# Patient Record
Sex: Female | Born: 1950 | Race: White | Hispanic: No | Marital: Single | Smoking: Never smoker
Health system: Southern US, Community
[De-identification: ages and names within clinical notes are randomized; demographics above are authoritative.]

## PROBLEM LIST (undated history)

## (undated) DIAGNOSIS — T7840XA Allergy, unspecified, initial encounter: Secondary | ICD-10-CM

## (undated) DIAGNOSIS — Z8542 Personal history of malignant neoplasm of other parts of uterus: Secondary | ICD-10-CM

## (undated) DIAGNOSIS — D649 Anemia, unspecified: Secondary | ICD-10-CM

## (undated) DIAGNOSIS — C801 Malignant (primary) neoplasm, unspecified: Secondary | ICD-10-CM

## (undated) DIAGNOSIS — I872 Venous insufficiency (chronic) (peripheral): Secondary | ICD-10-CM

## (undated) DIAGNOSIS — F419 Anxiety disorder, unspecified: Secondary | ICD-10-CM

## (undated) DIAGNOSIS — N189 Chronic kidney disease, unspecified: Secondary | ICD-10-CM

## (undated) DIAGNOSIS — E119 Type 2 diabetes mellitus without complications: Secondary | ICD-10-CM

## (undated) DIAGNOSIS — M1990 Unspecified osteoarthritis, unspecified site: Secondary | ICD-10-CM

## (undated) DIAGNOSIS — H409 Unspecified glaucoma: Secondary | ICD-10-CM

## (undated) DIAGNOSIS — E785 Hyperlipidemia, unspecified: Secondary | ICD-10-CM

## (undated) DIAGNOSIS — Z9889 Other specified postprocedural states: Secondary | ICD-10-CM

## (undated) DIAGNOSIS — I10 Essential (primary) hypertension: Secondary | ICD-10-CM

## (undated) DIAGNOSIS — H9190 Unspecified hearing loss, unspecified ear: Secondary | ICD-10-CM

## (undated) DIAGNOSIS — Z8541 Personal history of malignant neoplasm of cervix uteri: Secondary | ICD-10-CM

## (undated) DIAGNOSIS — T8859XA Other complications of anesthesia, initial encounter: Secondary | ICD-10-CM

## (undated) DIAGNOSIS — E2839 Other primary ovarian failure: Secondary | ICD-10-CM

## (undated) DIAGNOSIS — G629 Polyneuropathy, unspecified: Secondary | ICD-10-CM

## (undated) DIAGNOSIS — J45909 Unspecified asthma, uncomplicated: Secondary | ICD-10-CM

## (undated) DIAGNOSIS — F32A Depression, unspecified: Secondary | ICD-10-CM

## (undated) DIAGNOSIS — M5440 Lumbago with sciatica, unspecified side: Secondary | ICD-10-CM

## (undated) DIAGNOSIS — Z860100 Personal history of colon polyps, unspecified: Secondary | ICD-10-CM

## (undated) DIAGNOSIS — R112 Nausea with vomiting, unspecified: Secondary | ICD-10-CM

## (undated) HISTORY — DX: Other primary ovarian failure: E28.39

## (undated) HISTORY — DX: Unspecified osteoarthritis, unspecified site: M19.90

## (undated) HISTORY — PX: EYE SURGERY: SHX253

## (undated) HISTORY — DX: Personal history of malignant neoplasm of cervix uteri: Z85.41

## (undated) HISTORY — DX: Depression, unspecified: F32.A

## (undated) HISTORY — PX: MENISCUS REPAIR: SHX5179

## (undated) HISTORY — DX: Personal history of colon polyps, unspecified: Z86.0100

## (undated) HISTORY — PX: ABDOMINAL HYSTERECTOMY: SHX81

## (undated) HISTORY — DX: Morbid (severe) obesity due to excess calories: E66.01

## (undated) HISTORY — PX: LIPOMA EXCISION: SHX5283

## (undated) HISTORY — DX: Venous insufficiency (chronic) (peripheral): I87.2

## (undated) HISTORY — DX: Unspecified glaucoma: H40.9

## (undated) HISTORY — DX: Hyperlipidemia, unspecified: E78.5

## (undated) HISTORY — DX: Type 2 diabetes mellitus without complications: E11.9

## (undated) HISTORY — DX: Personal history of malignant neoplasm of other parts of uterus: Z85.42

## (undated) HISTORY — DX: Unspecified hearing loss, unspecified ear: H91.90

## (undated) HISTORY — DX: Essential (primary) hypertension: I10

## (undated) HISTORY — DX: Allergy, unspecified, initial encounter: T78.40XA

## (undated) HISTORY — DX: Chronic kidney disease, unspecified: N18.9

## (undated) HISTORY — DX: Lumbago with sciatica, unspecified side: M54.40

## (undated) HISTORY — PX: APPENDECTOMY: SHX54

## (undated) HISTORY — DX: Malignant (primary) neoplasm, unspecified: C80.1

---

## 2008-12-23 ENCOUNTER — Ambulatory Visit: Admission: RE | Admit: 2008-12-23 | Discharge: 2009-03-09 | Payer: Self-pay | Admitting: Radiation Oncology

## 2009-03-18 ENCOUNTER — Ambulatory Visit: Admission: RE | Admit: 2009-03-18 | Discharge: 2009-04-10 | Payer: Self-pay | Admitting: Radiation Oncology

## 2011-12-12 ENCOUNTER — Other Ambulatory Visit: Payer: Self-pay | Admitting: Family Medicine

## 2011-12-12 ENCOUNTER — Ambulatory Visit
Admission: RE | Admit: 2011-12-12 | Discharge: 2011-12-12 | Disposition: A | Discharge status: Home / Self Care | Payer: Commercial Managed Care - PPO | Source: Ambulatory Visit | Attending: Family Medicine | Admitting: Family Medicine

## 2011-12-12 DIAGNOSIS — M79606 Pain in leg, unspecified: Secondary | ICD-10-CM

## 2013-11-13 ENCOUNTER — Ambulatory Visit (INDEPENDENT_AMBULATORY_CARE_PROVIDER_SITE_OTHER): Payer: Commercial Managed Care - PPO | Admitting: Family Medicine

## 2013-11-13 VITALS — BP 120/74 | HR 96 | Temp 99.2°F | Resp 18 | Ht 68.5 in | Wt 341.1 lb

## 2013-11-13 DIAGNOSIS — J069 Acute upper respiratory infection, unspecified: Secondary | ICD-10-CM

## 2013-11-13 DIAGNOSIS — R059 Cough, unspecified: Secondary | ICD-10-CM

## 2013-11-13 DIAGNOSIS — J329 Chronic sinusitis, unspecified: Secondary | ICD-10-CM

## 2013-11-13 DIAGNOSIS — H6120 Impacted cerumen, unspecified ear: Secondary | ICD-10-CM

## 2013-11-13 DIAGNOSIS — R05 Cough: Secondary | ICD-10-CM

## 2013-11-13 DIAGNOSIS — H6121 Impacted cerumen, right ear: Secondary | ICD-10-CM

## 2013-11-13 MED ORDER — AMOXICILLIN 875 MG PO TABS: 875.0000 mg | ORAL_TABLET | Freq: Two times a day (BID) | ORAL | Status: DC

## 2013-11-13 MED ORDER — HYDROCODONE-HOMATROPINE 5-1.5 MG/5ML PO SYRP: 5.0000 mL | ORAL_SOLUTION | ORAL | Status: DC | PRN

## 2013-11-13 NOTE — Progress Notes (Signed)
Subjective: Patient has been sick with a cough, wheezing, sinus congestion and discomfort, and her ears stuffy. She feels like she had a lot of wax in her ear. She tried to irrigate them out.  Objective: Her left TM is normal right is occluded by cerumen. Her sinuses are tender. Throat clear. Right nares is very inflamed. Look like there is a polyp or pseudopolyp up in there but she has no history of chronic congestion. Her chest is fairly clear but is minimal tightness on expiration.  Ears were irrigated and a large plug removed from the right ear. She.  Assessment: Cerumen impaction Sinusitis Cough  Plan: Cough syrup Amoxicillin Return if not improved Drink plenty of fluids

## 2013-11-13 NOTE — Patient Instructions (Signed)
Drink plenty of fluids  Amoxicillin 875 mg twice daily  Hycodan cough syrup 1 teaspoon every 4-6 hours as needed for cough  Return if worse  If the ears continue to bother you take an antihistamine decongestant such as Claritin-D or Allegra-D or Zyrtec-D to see if we can get eustachian tubes open up.

## 2018-08-12 DIAGNOSIS — R69 Illness, unspecified: Secondary | ICD-10-CM | POA: Diagnosis not present

## 2018-10-01 DIAGNOSIS — R05 Cough: Secondary | ICD-10-CM | POA: Diagnosis not present

## 2018-10-01 DIAGNOSIS — J0140 Acute pansinusitis, unspecified: Secondary | ICD-10-CM | POA: Diagnosis not present

## 2018-10-13 DIAGNOSIS — Z1231 Encounter for screening mammogram for malignant neoplasm of breast: Secondary | ICD-10-CM | POA: Diagnosis not present

## 2018-10-28 DIAGNOSIS — M545 Low back pain: Secondary | ICD-10-CM | POA: Diagnosis not present

## 2018-10-28 DIAGNOSIS — R609 Edema, unspecified: Secondary | ICD-10-CM | POA: Diagnosis not present

## 2018-10-28 DIAGNOSIS — E782 Mixed hyperlipidemia: Secondary | ICD-10-CM | POA: Diagnosis not present

## 2018-10-28 DIAGNOSIS — R69 Illness, unspecified: Secondary | ICD-10-CM | POA: Diagnosis not present

## 2018-10-28 DIAGNOSIS — I10 Essential (primary) hypertension: Secondary | ICD-10-CM | POA: Diagnosis not present

## 2018-10-28 DIAGNOSIS — J45909 Unspecified asthma, uncomplicated: Secondary | ICD-10-CM | POA: Diagnosis not present

## 2018-10-28 DIAGNOSIS — E1169 Type 2 diabetes mellitus with other specified complication: Secondary | ICD-10-CM | POA: Diagnosis not present

## 2018-10-28 DIAGNOSIS — N183 Chronic kidney disease, stage 3 (moderate): Secondary | ICD-10-CM | POA: Diagnosis not present

## 2018-11-03 DIAGNOSIS — R69 Illness, unspecified: Secondary | ICD-10-CM | POA: Diagnosis not present

## 2018-11-03 DIAGNOSIS — M5416 Radiculopathy, lumbar region: Secondary | ICD-10-CM | POA: Diagnosis not present

## 2018-11-05 ENCOUNTER — Other Ambulatory Visit: Payer: Self-pay | Admitting: Orthopedic Surgery

## 2018-11-05 DIAGNOSIS — M5416 Radiculopathy, lumbar region: Secondary | ICD-10-CM

## 2018-11-07 ENCOUNTER — Ambulatory Visit
Admission: RE | Admit: 2018-11-07 | Discharge: 2018-11-07 | Disposition: A | Discharge status: Home / Self Care | Payer: Medicare Other | Source: Ambulatory Visit | Attending: Orthopedic Surgery | Admitting: Orthopedic Surgery

## 2018-11-07 DIAGNOSIS — M48061 Spinal stenosis, lumbar region without neurogenic claudication: Secondary | ICD-10-CM | POA: Diagnosis not present

## 2018-11-07 DIAGNOSIS — M5116 Intervertebral disc disorders with radiculopathy, lumbar region: Secondary | ICD-10-CM | POA: Diagnosis not present

## 2018-11-07 DIAGNOSIS — M5416 Radiculopathy, lumbar region: Secondary | ICD-10-CM

## 2018-11-10 DIAGNOSIS — R69 Illness, unspecified: Secondary | ICD-10-CM | POA: Diagnosis not present

## 2018-11-11 DIAGNOSIS — M48061 Spinal stenosis, lumbar region without neurogenic claudication: Secondary | ICD-10-CM | POA: Diagnosis not present

## 2018-11-12 DIAGNOSIS — M48061 Spinal stenosis, lumbar region without neurogenic claudication: Secondary | ICD-10-CM | POA: Diagnosis not present

## 2018-11-12 DIAGNOSIS — M4316 Spondylolisthesis, lumbar region: Secondary | ICD-10-CM | POA: Diagnosis not present

## 2018-11-18 DIAGNOSIS — M48061 Spinal stenosis, lumbar region without neurogenic claudication: Secondary | ICD-10-CM | POA: Diagnosis not present

## 2018-11-19 DIAGNOSIS — M48061 Spinal stenosis, lumbar region without neurogenic claudication: Secondary | ICD-10-CM | POA: Diagnosis not present

## 2018-11-19 DIAGNOSIS — M4316 Spondylolisthesis, lumbar region: Secondary | ICD-10-CM | POA: Diagnosis not present

## 2018-11-24 DIAGNOSIS — M48061 Spinal stenosis, lumbar region without neurogenic claudication: Secondary | ICD-10-CM | POA: Diagnosis not present

## 2018-11-24 DIAGNOSIS — M4316 Spondylolisthesis, lumbar region: Secondary | ICD-10-CM | POA: Diagnosis not present

## 2018-12-03 DIAGNOSIS — M48061 Spinal stenosis, lumbar region without neurogenic claudication: Secondary | ICD-10-CM | POA: Diagnosis not present

## 2018-12-03 DIAGNOSIS — M4316 Spondylolisthesis, lumbar region: Secondary | ICD-10-CM | POA: Diagnosis not present

## 2018-12-10 DIAGNOSIS — M48061 Spinal stenosis, lumbar region without neurogenic claudication: Secondary | ICD-10-CM | POA: Diagnosis not present

## 2018-12-10 DIAGNOSIS — M4316 Spondylolisthesis, lumbar region: Secondary | ICD-10-CM | POA: Diagnosis not present

## 2018-12-11 DIAGNOSIS — M48061 Spinal stenosis, lumbar region without neurogenic claudication: Secondary | ICD-10-CM | POA: Diagnosis not present

## 2018-12-17 DIAGNOSIS — M4316 Spondylolisthesis, lumbar region: Secondary | ICD-10-CM | POA: Diagnosis not present

## 2018-12-17 DIAGNOSIS — M48061 Spinal stenosis, lumbar region without neurogenic claudication: Secondary | ICD-10-CM | POA: Diagnosis not present

## 2019-01-27 DIAGNOSIS — R69 Illness, unspecified: Secondary | ICD-10-CM | POA: Diagnosis not present

## 2019-01-27 DIAGNOSIS — E782 Mixed hyperlipidemia: Secondary | ICD-10-CM | POA: Diagnosis not present

## 2019-01-27 DIAGNOSIS — J45909 Unspecified asthma, uncomplicated: Secondary | ICD-10-CM | POA: Diagnosis not present

## 2019-01-27 DIAGNOSIS — R609 Edema, unspecified: Secondary | ICD-10-CM | POA: Diagnosis not present

## 2019-01-27 DIAGNOSIS — L84 Corns and callosities: Secondary | ICD-10-CM | POA: Diagnosis not present

## 2019-01-27 DIAGNOSIS — I10 Essential (primary) hypertension: Secondary | ICD-10-CM | POA: Diagnosis not present

## 2019-01-27 DIAGNOSIS — E1169 Type 2 diabetes mellitus with other specified complication: Secondary | ICD-10-CM | POA: Diagnosis not present

## 2019-01-27 DIAGNOSIS — N183 Chronic kidney disease, stage 3 (moderate): Secondary | ICD-10-CM | POA: Diagnosis not present

## 2019-02-05 ENCOUNTER — Other Ambulatory Visit: Payer: Self-pay

## 2019-02-05 ENCOUNTER — Ambulatory Visit: Payer: Medicare HMO | Admitting: Podiatry

## 2019-02-05 ENCOUNTER — Encounter: Payer: Self-pay | Admitting: Podiatry

## 2019-02-05 ENCOUNTER — Ambulatory Visit (INDEPENDENT_AMBULATORY_CARE_PROVIDER_SITE_OTHER): Payer: Medicare HMO

## 2019-02-05 VITALS — Temp 96.6°F

## 2019-02-05 DIAGNOSIS — E1149 Type 2 diabetes mellitus with other diabetic neurological complication: Secondary | ICD-10-CM

## 2019-02-05 DIAGNOSIS — L97329 Non-pressure chronic ulcer of left ankle with unspecified severity: Secondary | ICD-10-CM

## 2019-02-05 DIAGNOSIS — L84 Corns and callosities: Secondary | ICD-10-CM

## 2019-02-09 NOTE — Progress Notes (Signed)
Subjective:   Patient ID: Wendy Soto, female   DOB: 68 y.o.   MRN: [id number]   HPI 68 year old female presents the office today for concerns of a callus on the bottom of her left foot.  She states that she thinks she may have cut her foot several months ago.  She does not recall any specific injury or any bleeding she denies of any foreign objects.  She thinks this was a cut because she noticed some blood in the callus formation.  She denies any redness or red streaks and she denies any drainage or pus.  Has had no recent treatment.   Review of Systems  All other systems reviewed and are negative.  Past Medical History:  Diagnosis Date  . Allergy   . Cancer ([place])   . Hypertension     Past Surgical History:  Procedure Laterality Date  . ABDOMINAL HYSTERECTOMY    . APPENDECTOMY    . EYE SURGERY       Current Outpatient Medications:  .  albuterol (VENTOLIN HFA) 108 (90 Base) MCG/ACT inhaler, Inhale into the lungs every 6 (six) hours as needed for wheezing or shortness of breath., Disp: , Rfl:  .  budesonide-formoterol (SYMBICORT) 160-4.5 MCG/ACT inhaler, Inhale 2 puffs into the lungs 2 (two) times daily., Disp: , Rfl:  .  fexofenadine (ALLEGRA) 180 MG tablet, Take 180 mg by mouth daily., Disp: , Rfl:  .  POTASSIUM CHLORIDE PO, Take by mouth., Disp: , Rfl:  .  rosuvastatin (CRESTOR) 5 MG tablet, Take 5 mg by mouth daily., Disp: , Rfl:  .  citalopram (CELEXA) 40 MG tablet, Take 40 mg by mouth daily., Disp: , Rfl:  .  fenofibrate (TRICOR) 145 MG tablet, Take 145 mg by mouth daily., Disp: , Rfl:  .  furosemide (LASIX) 40 MG tablet, Take 40 mg by mouth daily., Disp: , Rfl:  .  LISINOPRIL PO, Take 20 mg by mouth 1 day or 1 dose. , Disp: , Rfl:   No Known Allergies       Objective:  Physical Exam  General: AAO x3, NAD  Dermatological: Hyperkeratotic tissue right foot submetatarsal 1 area.  There was some evidence of dried blood.  Upon debridement there is no ongoing ulceration,  drainage or any signs of infection to the area is pre-ulcerative.  There is no surrounding erythema, ascending cellulitis.  There is no fluctuation or crepitation malodor.  Minimal callus in the same area on the contralateral extremity.  Vascular: Dorsalis Pedis artery and Posterior Tibial artery pedal pulses are 2/4 bilateral with immedate capillary fill time. There is no pain with calf compression, swelling, warmth, erythema.   Neruologic: Sensation mildly decreased with Semmes Weinstein monofilament  Musculoskeletal: No gross boney pedal deformities bilateral. No pain, crepitus, or limitation noted with foot and ankle range of motion bilateral. Muscular strength 5/5 in all groups tested bilateral.     Assessment:   Pre-ulcerative callus left foot     Plan:  -Treatment options discussed including all alternatives, risks, and complications -Etiology of symptoms were discussed -X-rays were obtained and reviewed with the patient.  There is no evidence of acute fracture or stress fracture or any evidence of foreign body. -I debrided the hyperkeratotic lesion without any complications or bleeding.  Discussed moisturizer to the area daily as well as offloading pads which were dispensed today.  Discussed shoe modifications as well. -Mild neuropathy is evident today.  We will continue to monitor.  No significant symptoms of this. -  Discussed importance of daily foot inspection.  Return in about 6 weeks (around 03/19/2019).  Trula Slade DPM

## 2019-03-06 ENCOUNTER — Ambulatory Visit: Payer: Medicare HMO | Admitting: Podiatry

## 2019-04-03 ENCOUNTER — Other Ambulatory Visit: Payer: Self-pay

## 2019-04-03 ENCOUNTER — Ambulatory Visit: Payer: Medicare HMO | Admitting: Podiatry

## 2019-04-03 VITALS — Temp 97.2°F

## 2019-04-03 DIAGNOSIS — E1149 Type 2 diabetes mellitus with other diabetic neurological complication: Secondary | ICD-10-CM

## 2019-04-03 DIAGNOSIS — R200 Anesthesia of skin: Secondary | ICD-10-CM

## 2019-04-03 DIAGNOSIS — L84 Corns and callosities: Secondary | ICD-10-CM

## 2019-04-03 DIAGNOSIS — E119 Type 2 diabetes mellitus without complications: Secondary | ICD-10-CM | POA: Insufficient documentation

## 2019-04-03 DIAGNOSIS — R208 Other disturbances of skin sensation: Secondary | ICD-10-CM

## 2019-04-03 NOTE — Progress Notes (Signed)
Subjective: 68 year old female presents the office today for 2 concerns.  She states that she has a still on the ball of her left foot.  It was painful but it is doing better.  She is not sure why it became painful several weeks ago but the tenderness has improved.  She denies any redness or drainage or any swelling.  She also states that the left foot is been getting numb and this occurs mostly when she stands in a grocery store where she stands at home for.  Time.  Typically in this time she is not wearing supportive shoes or going barefoot at home.  She does not get the numbness when she wears supportive shoes during the day. She does have a history of degenerative disc disease which affects her right side but not her left.  This is been ongoing for last 6 weeks.  She denies any weakness or falls.  Denies any systemic complaints such as fevers, chills, nausea, vomiting. No acute changes since last appointment, and no other complaints at this time.  She is type II diabetic but she does not take medication. Her last A1c was "5-something".    Objective: AAO x3, NAD DP/PT pulses palpable bilaterally, CRT less than 3 seconds Sensation decreased with Semmes-Weinstein monofilament.  There is also decrease in vibratory sensation with the left side worse than the right.  There is no pain with pinpoint tenderness.  No edema, erythema.  MMT 5/5.  Range of motion intact. Plantarflexed first ray resulting hyperkeratotic lesion submetatarsal 5.  Upon debridement no ongoing ulceration drainage or signs of infection however the are is pre-ulcerative.  Mild hyperkeratotic lesion fifth metatarsal base right foot without any underlying ulceration or signs of infection. No open lesions or pre-ulcerative lesions.  No pain with calf compression, swelling, warmth, erythema  Assessment: Pre-ulcerative callus left foot; numbness left foot  Plan: -All treatment options discussed with the patient including all  alternatives, risks, complications.  -Debrided the hyperkeratotic lesion without any complications bleeding.  Continue offloading. -She states the entire left foot is the number does not go above the ankle. No falls or weakness.  She states this is occurring when she does not wear supportive shoes or going barefoot.  We discussed wearing more supportive shoes to see if this will be helpful not diabetic.  Also late on getting an injection in her back does not schedule this.  Concerned that maybe her back is causing some of the issues to her left foot becoming numb.  If this is not the case that she is not getting improvement with supportive shoes and change in shoe gear ordered nerve conduction test. -Patient encouraged to call the office with any questions, concerns, change in symptoms.   Trula Slade DPM

## 2019-04-04 DIAGNOSIS — Z791 Long term (current) use of non-steroidal anti-inflammatories (NSAID): Secondary | ICD-10-CM | POA: Diagnosis not present

## 2019-04-04 DIAGNOSIS — E1151 Type 2 diabetes mellitus with diabetic peripheral angiopathy without gangrene: Secondary | ICD-10-CM | POA: Diagnosis not present

## 2019-04-04 DIAGNOSIS — M1990 Unspecified osteoarthritis, unspecified site: Secondary | ICD-10-CM | POA: Diagnosis not present

## 2019-04-04 DIAGNOSIS — J45909 Unspecified asthma, uncomplicated: Secondary | ICD-10-CM | POA: Diagnosis not present

## 2019-04-04 DIAGNOSIS — E785 Hyperlipidemia, unspecified: Secondary | ICD-10-CM | POA: Diagnosis not present

## 2019-04-04 DIAGNOSIS — R32 Unspecified urinary incontinence: Secondary | ICD-10-CM | POA: Diagnosis not present

## 2019-04-04 DIAGNOSIS — R69 Illness, unspecified: Secondary | ICD-10-CM | POA: Diagnosis not present

## 2019-04-04 DIAGNOSIS — R609 Edema, unspecified: Secondary | ICD-10-CM | POA: Diagnosis not present

## 2019-04-04 DIAGNOSIS — I10 Essential (primary) hypertension: Secondary | ICD-10-CM | POA: Diagnosis not present

## 2019-04-04 DIAGNOSIS — G8929 Other chronic pain: Secondary | ICD-10-CM | POA: Diagnosis not present

## 2019-04-09 ENCOUNTER — Ambulatory Visit: Payer: Medicare HMO | Admitting: Podiatry

## 2019-04-30 DIAGNOSIS — M48061 Spinal stenosis, lumbar region without neurogenic claudication: Secondary | ICD-10-CM | POA: Diagnosis not present

## 2019-05-04 DIAGNOSIS — N3090 Cystitis, unspecified without hematuria: Secondary | ICD-10-CM | POA: Diagnosis not present

## 2019-05-11 ENCOUNTER — Encounter: Payer: Self-pay | Admitting: Podiatry

## 2019-05-15 ENCOUNTER — Ambulatory Visit: Payer: Medicare HMO | Admitting: Podiatry

## 2019-05-22 DIAGNOSIS — R399 Unspecified symptoms and signs involving the genitourinary system: Secondary | ICD-10-CM | POA: Diagnosis not present

## 2019-05-27 DIAGNOSIS — R350 Frequency of micturition: Secondary | ICD-10-CM | POA: Diagnosis not present

## 2019-06-19 ENCOUNTER — Telehealth: Payer: Self-pay | Admitting: *Deleted

## 2019-06-19 ENCOUNTER — Ambulatory Visit: Payer: Medicare HMO | Admitting: Podiatry

## 2019-06-19 ENCOUNTER — Other Ambulatory Visit: Payer: Self-pay

## 2019-06-19 ENCOUNTER — Encounter: Payer: Self-pay | Admitting: Podiatry

## 2019-06-19 VITALS — Temp 97.9°F

## 2019-06-19 DIAGNOSIS — R200 Anesthesia of skin: Secondary | ICD-10-CM

## 2019-06-19 DIAGNOSIS — R208 Other disturbances of skin sensation: Secondary | ICD-10-CM | POA: Diagnosis not present

## 2019-06-19 DIAGNOSIS — L84 Corns and callosities: Secondary | ICD-10-CM

## 2019-06-19 DIAGNOSIS — G629 Polyneuropathy, unspecified: Secondary | ICD-10-CM

## 2019-06-19 DIAGNOSIS — E1149 Type 2 diabetes mellitus with other diabetic neurological complication: Secondary | ICD-10-CM

## 2019-06-19 NOTE — Telephone Encounter (Signed)
Faxed orders, clinicals and demographics to [facility]. 

## 2019-06-19 NOTE — Telephone Encounter (Signed)
done

## 2019-06-19 NOTE — Telephone Encounter (Signed)
-----   Message from Trula Slade, DPM sent at 06/19/2019  9:18 AM EDT ----- Can you please order NCV due to numbness b/l L> R

## 2019-06-19 NOTE — Telephone Encounter (Signed)
Prepared orders to be faxed to [facility] once 06/19/2019 clinicals are available.

## 2019-06-19 NOTE — Progress Notes (Signed)
Subjective: 68 year old female presents the office today for follow-up evaluation of numbness to mostly her left foot.  The numbness is mostly after she is been standing about 10 to 15 minutes and mostly when she is going grocery shopping.  She did have an injection in her back which helped some but never went away.  She also has a callus on the ball of her right foot so that trimmed.  No redness or any open sores. Denies any systemic complaints such as fevers, chills, nausea, vomiting. No acute changes since last appointment, and no other complaints at this time.   Objective: AAO x3, NAD DP/PT pulses palpable bilaterally, CRT less than 3 seconds Sensation decreased with Semmes-Weinstein monofilament bilaterally.  Negative Tinel sign. Hyperkeratotic lesion left foot submetatarsal 1.  No ongoing ulceration drainage or any signs of infection.  No open lesions. No pain with calf compression, swelling, warmth, erythema  Assessment: Neuropathy, hyperkeratotic pre-ulcerative lesion  Plan: -All treatment options discussed with the patient including all alternatives, risks, complications.  -Given her neuropathy as well as the numbness to her feet will order nerve conduction test. -Hyperkeratotic lesion sharply debrided x1 without any complications or bleeding. -Patient encouraged to call the office with any questions, concerns, change in symptoms.   RTC after NCV  Trula Slade DPM

## 2019-07-28 ENCOUNTER — Other Ambulatory Visit: Payer: Self-pay

## 2019-07-28 ENCOUNTER — Ambulatory Visit (INDEPENDENT_AMBULATORY_CARE_PROVIDER_SITE_OTHER): Payer: Medicare HMO | Admitting: Neurology

## 2019-07-28 DIAGNOSIS — E1149 Type 2 diabetes mellitus with other diabetic neurological complication: Secondary | ICD-10-CM | POA: Diagnosis not present

## 2019-07-28 DIAGNOSIS — M5417 Radiculopathy, lumbosacral region: Secondary | ICD-10-CM

## 2019-07-28 NOTE — Procedures (Signed)
[facility]  [address], [address]  [place], [place] [address] Tel: [phone] Fax:  [phone] Test Date:  07/28/2019  Patient: Wendy Soto DOB: [birth year] Physician: Narda Amber, DO  Sex: Female Height: 5\' 8"  Ref Phys: Celesta Gentile, DPM  ID#: [id number] Temp: 32.0C Technician:    Patient Complaints: This is a 68 year old female with diabetic neuropathy referred for evaluation of left foot numbness following prolonged standing or walking.  NCV & EMG Findings: Extensive electrodiagnostic testing of the left lower extremity and additional studies of the right shows:  1. Bilateral sural and superficial peroneal sensory responses are within normal limits. 2. Bilateral tibial and left peroneal (EDB) motor responses are absent.  Right peroneal (EDB) motor response is markedly reduced.  Bilateral peroneal motor responses at the tibialis anterior are within normal limits. 3. Tibial H reflex study is prolonged on the left, and normal on the right. 4. Chronic motor axonal loss changes are seen affecting the muscles below the knee as well as bilateral gluteus medius muscles.  There is no evidence of accompanied active denervation.   Impression: 1. The electrophysiologic findings are consistent with a chronic and symmetric sensorimotor axonal polyneuropathy affecting the lower extremities. 2. There is evidence of a superimposed L5 radiculopathy affecting bilateral lower extremities, worse on the left.    ___________________________ Narda Amber, DO    Nerve Conduction Studies Anti Sensory Summary Table   Site NR Peak (ms) Norm Peak (ms) P-T Amp (V) Norm P-T Amp  Left Sup Peroneal Anti Sensory (Ant Lat Mall)  32C  12 cm NR  <4.6  >3  Right Sup Peroneal Anti Sensory (Ant Lat Mall)  32C  12 cm NR  <4.6  >3  Left Sural Anti Sensory (Lat Mall)  32C  Calf NR  <4.6  >3  Right Sural Anti Sensory (Lat Mall)  32C  Calf NR  <4.6  >3   Motor Summary Table   Site NR  Onset (ms) Norm Onset (ms) O-P Amp (mV) Norm O-P Amp Site1 Site2 Delta-0 (ms) Dist (cm) Vel (m/s) Norm Vel (m/s)  Left Peroneal Motor (Ext Dig Brev)  32C  Ankle NR  <6.0  >2.5 B Fib Ankle  0.0  >40  B Fib NR     Poplt B Fib  0.0  >40  Poplt NR            Right Peroneal Motor (Ext Dig Brev)  32C  Ankle    3.4 <6.0 0.8 >2.5 B Fib Ankle 9.1 36.0 40 >40  B Fib    12.5  0.8  Poplt B Fib 1.3 6.0 46 >40  Poplt    13.8  0.7         Left Peroneal TA Motor (Tib Ant)  32C  Fib Head    2.9 <4.5 3.3 >3 Poplit Fib Head 1.1 7.0 64 >40  Poplit    4.0  3.3         Right Peroneal TA Motor (Tib Ant)  32C  Fib Head    3.0 <4.5 3.3 >3 Poplit Fib Head 1.2 7.0 58 >40  Poplit    4.2  2.9         Left Tibial Motor (Abd Hall Brev)  32C  Ankle NR  <6.0  >4 Knee Ankle  0.0  >40  Knee NR            Right Tibial Motor (Abd Hall Brev)  32C  Ankle NR  <6.0  >4  Knee Ankle  0.0  >40  Knee NR             H Reflex Studies   NR H-Lat (ms) Lat Norm (ms) L-R H-Lat (ms)  Left Tibial (Gastroc)  32C     40.00 <35 5.03  Right Tibial (Gastroc)  32C     34.97 <35 5.03   EMG   Side Muscle Ins Act Fibs Psw Fasc Number Recrt Dur Dur. Amp Amp. Poly Poly. Comment  Left AntTibialis Nml Nml Nml Nml 2- Rapid Many 1+ Many 1+ Some 1+ N/A  Left Gastroc Nml Nml Nml Nml 2- Rapid Few 1+ Few 1+ Nml Nml N/A  Left RectFemoris Nml Nml Nml Nml 1- Mod-V Nml Nml Nml Nml Nml Nml N/A  Left BicepsFemS Nml Nml Nml Nml Nml Nml Nml Nml Nml Nml Nml Nml N/A  Left GluteusMed Nml Nml Nml Nml 1- Rapid Many 1+ Many 1+ Some 1+ N/A  Right AntTibialis Nml Nml Nml Nml 1- Rapid Many 1+ Many 1+ Many 1+ N/A  Right Gastroc Nml Nml Nml Nml 2- Rapid Few 1+ Few 1+ Nml Nml N/A  Right RectFemoris Nml Nml Nml Nml Nml Nml Nml Nml Nml Nml Nml Nml N/A  Right GluteusMed Nml Nml Nml Nml 1- Rapid Some 1+ Some 1+ Nml Nml N/A  Right BicepsFemS Nml Nml Nml Nml Nml Nml Nml Nml Nml Nml Nml Nml N/A      Waveforms:

## 2019-07-31 ENCOUNTER — Telehealth: Payer: Self-pay | Admitting: *Deleted

## 2019-07-31 DIAGNOSIS — G629 Polyneuropathy, unspecified: Secondary | ICD-10-CM

## 2019-07-31 DIAGNOSIS — R208 Other disturbances of skin sensation: Secondary | ICD-10-CM

## 2019-07-31 DIAGNOSIS — R200 Anesthesia of skin: Secondary | ICD-10-CM

## 2019-07-31 NOTE — Telephone Encounter (Signed)
I informed pt of Dr. Leigh Aurora review of the NCV with EMG and referral to [facility]. Pt states understanding. Faxed referral, clinicals and demographics to [facility].

## 2019-07-31 NOTE — Telephone Encounter (Signed)
-----   Message from Trula Slade, DPM sent at 07/31/2019  7:30 AM EDT ----- Tivis Ringer- can you please refer her to neurology due to the L5 radiculopathy that is causing the numbness and please let her know. Thanks.

## 2019-08-03 ENCOUNTER — Other Ambulatory Visit (INDEPENDENT_AMBULATORY_CARE_PROVIDER_SITE_OTHER): Payer: Medicare HMO

## 2019-08-03 ENCOUNTER — Encounter: Payer: Self-pay | Admitting: Neurology

## 2019-08-03 ENCOUNTER — Other Ambulatory Visit: Payer: Self-pay

## 2019-08-03 ENCOUNTER — Ambulatory Visit: Payer: Medicare HMO | Admitting: Neurology

## 2019-08-03 VITALS — BP 146/78 | HR 98 | Ht 68.0 in | Wt 318.0 lb

## 2019-08-03 DIAGNOSIS — G629 Polyneuropathy, unspecified: Secondary | ICD-10-CM | POA: Diagnosis not present

## 2019-08-03 DIAGNOSIS — M5417 Radiculopathy, lumbosacral region: Secondary | ICD-10-CM | POA: Diagnosis not present

## 2019-08-03 DIAGNOSIS — M48062 Spinal stenosis, lumbar region with neurogenic claudication: Secondary | ICD-10-CM

## 2019-08-03 NOTE — Patient Instructions (Addendum)
Check labs.  We will call you with the results Your provider has requested that you have labwork completed today. Please go to [facility] ([id number]) on the second floor of this building before leaving the office today. You do not need to check in. If you are not called within 15 minutes please check with the front desk. Please follow-up with Dr. Lynann Bologna for your back

## 2019-08-03 NOTE — Progress Notes (Signed)
[facility] Clinic Note - Initial Visit   Date: 08/03/19  Wendy Soto MRN: [id number] DOB: 1950/11/09   Dear Dr. Jacqualyn Posey:  Thank you for your kind referral of Wendy Soto for consultation of left foot numbness. Although her history is well known to you, please allow [place] to reiterate it for the purpose of our medical record. The patient was accompanied to the clinic by self.    History of Present Illness: Wendy Soto is a 68 y.o. right-handed female with prediabetes, asthma, hyperlipidemia, history of cervical cancer, and depression/anxiety presenting for evaluation of left foot numbness.   Starting early 2020, she began having left foot numbness.  Numbness is not constant and only occurs after standing or walking for longer than 15 minutes.  If she rests, symptoms quickly improve.  She denies any foot weakness or shooting pain.  She has seen Dr. Lynann Bologna for low back pain and had MRI lumbar spine in January 2020 which showed moderate to severe L4-5 spinal canal stenosis.  She had 2 epidural steroid injections which may have helped alleviate the numbness transiently.  When her symptoms occur, it feels as if her left foot is in a boot.  At rest, she does endorse some numbness involving both feet.  Her brother has severe neuropathy and she has wondered whether she may be developing the same.  She is prediabetic.  She does not drink alcohol.  She underwent electrodiagnostic testing which I performed in September which showed chronic and symmetric sensorimotor polyneuropathy, as well as overlapping L5 radiculopathy bilaterally, worse on the left.  She works as a Retail buyer at [employer].  Out-side paper records, electronic medical record, and images have been reviewed where available and summarized as:  MRI lumbar spine wo contrast 11/07/2018: Mild spinal stenosis L1-2 with small left-sided disc protrusion  Mild spinal stenosis L2-3 and L3-4  Moderate to  severe spinal stenosis L4-5. Severe facet degeneration with grade 1 anterolisthesis.  NCS/EMG of the legs 07/20/2019: 1. The electrophysiologic findings are consistent with a chronic and symmetric sensorimotor axonal polyneuropathy affecting the lower extremities. 2. There is evidence of a superimposed L5 radiculopathy affecting bilateral lower extremities, worse on the left.   Past Medical History:  Diagnosis Date  . Allergy   . Cancer ([place])   . Hypertension     Past Surgical History:  Procedure Laterality Date  . ABDOMINAL HYSTERECTOMY    . APPENDECTOMY    . EYE SURGERY       Medications:  Outpatient Encounter Medications as of 08/03/2019  Medication Sig  . albuterol (VENTOLIN HFA) 108 (90 Base) MCG/ACT inhaler Inhale into the lungs every 6 (six) hours as needed for wheezing or shortness of breath.  . Blood Glucose Monitoring Suppl (ONE TOUCH ULTRA 2) w/Device KIT AS DIRECTED USE TO TEST BLOOD SUGARS DAILY INVITRO/ DX E11.65  . budesonide-formoterol (SYMBICORT) 160-4.5 MCG/ACT inhaler Inhale 2 puffs into the lungs 2 (two) times daily.  . citalopram (CELEXA) 40 MG tablet Take 40 mg by mouth daily.  . fenofibrate (TRICOR) 145 MG tablet Take 145 mg by mouth daily.  . fexofenadine (ALLEGRA) 180 MG tablet Take 180 mg by mouth daily.  . furosemide (LASIX) 40 MG tablet Take 40 mg by mouth daily.  Marland Kitchen LISINOPRIL PO Take 20 mg by mouth 1 day or 1 dose.   Marland Kitchen POTASSIUM CHLORIDE PO Take by mouth.  . rosuvastatin (CRESTOR) 5 MG tablet Take 5 mg by mouth daily.   No facility-administered  encounter medications on file as of 08/03/2019.     Allergies: No Known Allergies  Family History: Brother has lymphoma, neuropathy  Social History: Social History   Tobacco Use  . Smoking status: Never Smoker  . Smokeless tobacco: Never Used  Substance Use Topics  . Alcohol use: No  . Drug use: No   Social History   Social History Narrative   One story home   No children   Usually right  handed    Review of Systems:  CONSTITUTIONAL: No fevers, chills, night sweats, or weight loss.   EYES: No visual changes or eye pain ENT: No hearing changes.  No history of nose bleeds.   RESPIRATORY: No cough, wheezing and shortness of breath.   CARDIOVASCULAR: Negative for chest pain, and palpitations.   GI: Negative for abdominal discomfort, blood in stools or black stools.  No recent change in bowel habits.   GU:  No history of incontinence.   MUSCLOSKELETAL: No history of joint pain or swelling.  No myalgias.   SKIN: Negative for lesions, rash, and itching.   HEMATOLOGY/ONCOLOGY: Negative for prolonged bleeding, bruising easily, and swollen nodes.  No history of cancer.   ENDOCRINE: Negative for cold or heat intolerance, polydipsia or goiter.   PSYCH:  No depression or anxiety symptoms.   NEURO: As Above.   Vital Signs:  BP (!) 146/78   Pulse 98   Ht _0  (1.727 m)   Wt (!) 318 lb (144.2 kg)   SpO2 99%   BMI 48.35 kg/m    General Medical Exam:   General:  Well appearing, comfortable, morbidly obese.   Eyes/ENT: see cranial nerve examination.   Neck:   No carotid bruits. Respiratory:  Clear to auscultation, good air entry bilaterally.   Cardiac:  Regular rate and rhythm, no murmur.   Extremities:  No deformities, edema, or skin discoloration.  Skin:  No rashes or lesions.  Neurological Exam: MENTAL STATUS including orientation to time, place, person, recent and remote memory, attention span and concentration, language, and fund of knowledge is normal.  Speech is not dysarthric.  CRANIAL NERVES: II:  No visual field defects.   III-IV-VI: Pupils equal round and reactive to light.  Normal conjugate, extra-ocular eye movements in all directions of gaze.  No nystagmus.  No ptosis.   V:  Normal facial sensation.    VII:  Normal facial symmetry and movements.   VIII:  Normal hearing and vestibular function.   IX-X:  Normal palatal movement.   XI:  Normal shoulder shrug  and head rotation.   XII:  Normal tongue strength and range of motion, no deviation or fasciculation.  MOTOR:  No atrophy, fasciculations or abnormal movements.  No pronator drift.   Upper Extremity:  Right  Left  Deltoid  5/5   5/5   Biceps  5/5   5/5   Triceps  5/5   5/5   Infraspinatus 5/5  5/5  Medial pectoralis 5/5  5/5  Wrist extensors  5/5   5/5   Wrist flexors  5/5   5/5   Finger extensors  5/5   5/5   Finger flexors  5/5   5/5   Dorsal interossei  5/5   5/5   Abductor pollicis  5/5   5/5   Tone (Ashworth scale)  0  0   Lower Extremity:  Right  Left  Hip flexors  5/5   5/5   Hip extensors  5/5   5/5  Adductor 5/5  5/5  Abductor 5/5  5/5  Knee flexors  5/5   5/5   Knee extensors  5/5   5/5   Dorsiflexors  5/5   5/5   Plantarflexors  5/5   5/5   Toe extensors  5/5   5/5   Toe flexors  5/5   5/5   Tone (Ashworth scale)  0  0   MSRs:  Right        Left                  brachioradialis 2+  2+  biceps 2+  2+  triceps 2+  2+  patellar 2+  2+  ankle jerk 0  0  Hoffman no  no  plantar response down  down   SENSORY: Gradient pattern of pinprick and temperature loss distal to mid-calf bilaterally, absent vibration at the ankle and great toe bilaterally.  Romberg sign is mildly positive.  COORDINATION/GAIT: Normal finger-to- nose-finger.  Intact rapid alternating movements bilaterally.  Gait is wide-based due to body habitus, appears stable and unassisted.  IMPRESSION: 1.  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 Lynann Bologna  2.  Peripheral neuropathy with distal sensory loss, contributed by prediabetes  - Check vitamin B12, folate, vitamin B12, copper, SPEP with IFE, TSH  -  I had extensive discussion with the patient regarding the pathogenesis, etiology, management, and natural course of neuropathy. Neuropathy tends to be slowly progressive, especially if a treatable etiology is not identified.  She does  have prediabetes which is a risk factor for neuropathy.   - Patient educated on daily foot inspection, fall prevention, and safety precautions around the home.  Further recommendations pending results.  Thank you for allowing me to participate in patient's care.  If I can answer any additional questions, I would be pleased to do so.    Sincerely,    Tanmay Halteman K. Posey Pronto, DO

## 2019-08-10 DIAGNOSIS — M4807 Spinal stenosis, lumbosacral region: Secondary | ICD-10-CM | POA: Diagnosis not present

## 2019-08-12 LAB — PROTEIN ELECTROPHORESIS, SERUM
Albumin ELP: 3.8 g/dL (ref 3.8–4.8)
Alpha 1: 0.3 g/dL (ref 0.2–0.3)
Alpha 2: 0.7 g/dL (ref 0.5–0.9)
Beta 2: 0.3 g/dL (ref 0.2–0.5)
Beta Globulin: 0.5 g/dL (ref 0.4–0.6)
Gamma Globulin: 0.7 g/dL — ABNORMAL LOW (ref 0.8–1.7)
Total Protein: 6.3 g/dL (ref 6.1–8.1)

## 2019-08-12 LAB — B12 AND FOLATE PANEL
Folate: 6.9 ng/mL
Vitamin B-12: 267 pg/mL (ref 200–1100)

## 2019-08-12 LAB — IMMUNOFIXATION ELECTROPHORESIS
IgG (Immunoglobin G), Serum: 675 mg/dL (ref 600–1540)
IgM, Serum: 158 mg/dL (ref 50–300)
Immunofix Electr Int: NOT DETECTED
Immunoglobulin A: 61 mg/dL — ABNORMAL LOW (ref 70–320)

## 2019-08-12 LAB — VITAMIN B1: Vitamin B1 (Thiamine): 14 nmol/L (ref 8–30)

## 2019-08-12 LAB — COPPER, SERUM: Copper: 105 ug/dL (ref 70–175)

## 2019-08-13 ENCOUNTER — Telehealth: Payer: Self-pay

## 2019-08-13 DIAGNOSIS — R69 Illness, unspecified: Secondary | ICD-10-CM | POA: Diagnosis not present

## 2019-08-13 NOTE — Telephone Encounter (Signed)
Patient left vm returning your call

## 2019-08-13 NOTE — Telephone Encounter (Signed)
Pt advised.

## 2019-08-13 NOTE — Telephone Encounter (Signed)
-----   Message from Alda Berthold, DO sent at 08/13/2019  9:30 AM EDT ----- Please notify patient lab are within normal limits.  Thank you.

## 2019-08-21 DIAGNOSIS — E782 Mixed hyperlipidemia: Secondary | ICD-10-CM | POA: Diagnosis not present

## 2019-08-21 DIAGNOSIS — I10 Essential (primary) hypertension: Secondary | ICD-10-CM | POA: Diagnosis not present

## 2019-08-21 DIAGNOSIS — R609 Edema, unspecified: Secondary | ICD-10-CM | POA: Diagnosis not present

## 2019-08-21 DIAGNOSIS — R69 Illness, unspecified: Secondary | ICD-10-CM | POA: Diagnosis not present

## 2019-08-21 DIAGNOSIS — J45909 Unspecified asthma, uncomplicated: Secondary | ICD-10-CM | POA: Diagnosis not present

## 2019-08-21 DIAGNOSIS — E1169 Type 2 diabetes mellitus with other specified complication: Secondary | ICD-10-CM | POA: Diagnosis not present

## 2019-08-21 DIAGNOSIS — N183 Chronic kidney disease, stage 3 unspecified: Secondary | ICD-10-CM | POA: Diagnosis not present

## 2019-09-02 DIAGNOSIS — H40013 Open angle with borderline findings, low risk, bilateral: Secondary | ICD-10-CM | POA: Diagnosis not present

## 2019-09-02 DIAGNOSIS — E119 Type 2 diabetes mellitus without complications: Secondary | ICD-10-CM | POA: Diagnosis not present

## 2019-09-02 DIAGNOSIS — H25813 Combined forms of age-related cataract, bilateral: Secondary | ICD-10-CM | POA: Diagnosis not present

## 2019-09-02 DIAGNOSIS — H04123 Dry eye syndrome of bilateral lacrimal glands: Secondary | ICD-10-CM | POA: Diagnosis not present

## 2019-09-16 DIAGNOSIS — Z23 Encounter for immunization: Secondary | ICD-10-CM | POA: Diagnosis not present

## 2019-09-16 DIAGNOSIS — E782 Mixed hyperlipidemia: Secondary | ICD-10-CM | POA: Diagnosis not present

## 2019-09-16 DIAGNOSIS — E1169 Type 2 diabetes mellitus with other specified complication: Secondary | ICD-10-CM | POA: Diagnosis not present

## 2019-10-07 DIAGNOSIS — H5213 Myopia, bilateral: Secondary | ICD-10-CM | POA: Diagnosis not present

## 2019-10-07 DIAGNOSIS — H40013 Open angle with borderline findings, low risk, bilateral: Secondary | ICD-10-CM | POA: Diagnosis not present

## 2019-10-07 DIAGNOSIS — H524 Presbyopia: Secondary | ICD-10-CM | POA: Diagnosis not present

## 2019-10-16 DIAGNOSIS — Z1231 Encounter for screening mammogram for malignant neoplasm of breast: Secondary | ICD-10-CM | POA: Diagnosis not present

## 2019-12-03 DIAGNOSIS — M48061 Spinal stenosis, lumbar region without neurogenic claudication: Secondary | ICD-10-CM | POA: Diagnosis not present

## 2019-12-26 IMAGING — MR MR LUMBAR SPINE W/O CM
4 of 5 series · 26 of 48 positions shown · non-contrast
Comparison: None.

CLINICAL DATA: Lumbar radiculopathy

EXAM:
MRI LUMBAR SPINE WITHOUT CONTRAST
TECHNIQUE: Multiplanar, multisequence MR imaging of the lumbar spine was
performed. No intravenous contrast was administered.

[Series 3: T2 post-contrast · sagittal · 4.0mm · 0.55mm/px · 6 of 14 slices shown]
[im 1/14]
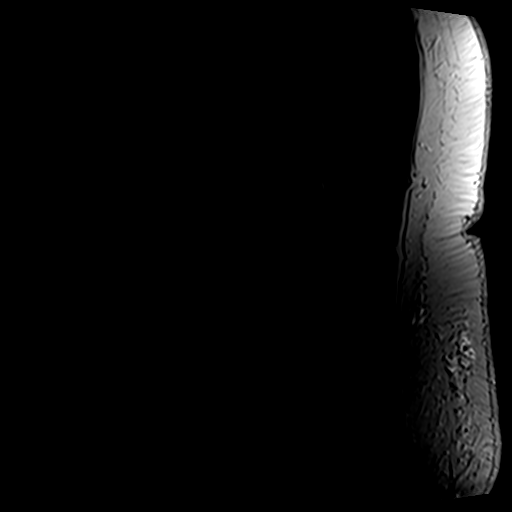
[im 3/14]
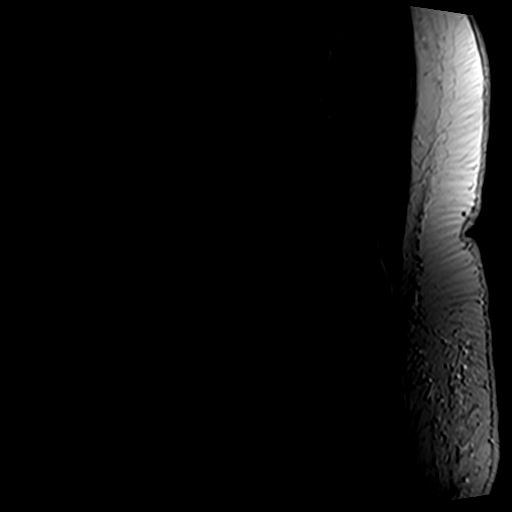
[im 6/14]
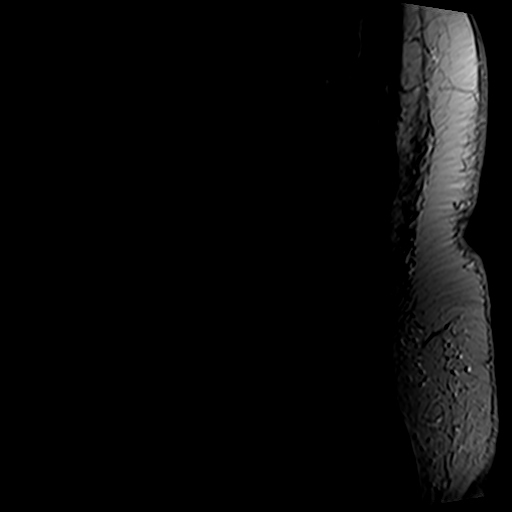
[im 8/14]
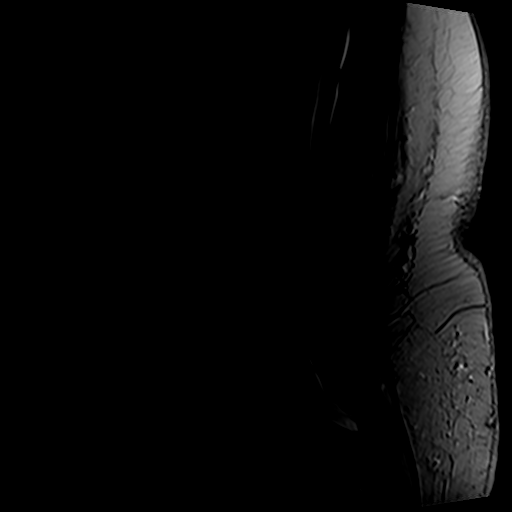
[im 11/14]
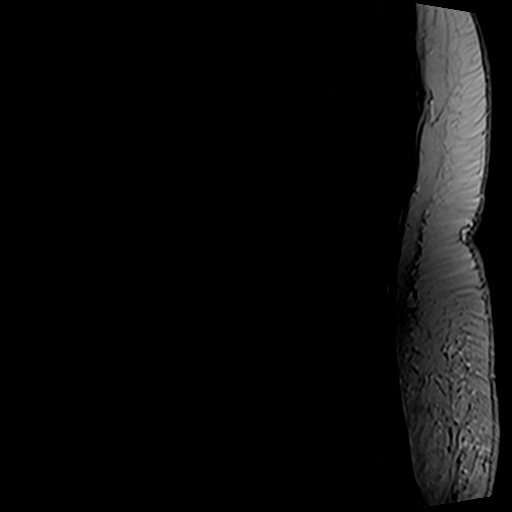
[im 14/14]
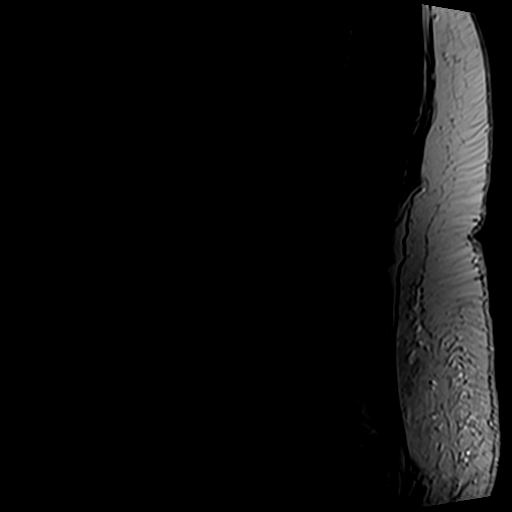

[Series 5: T1 · sagittal · 4.0mm · 0.55mm/px · 6 of 14 slices shown (1 of 2)]
[im 1/14]
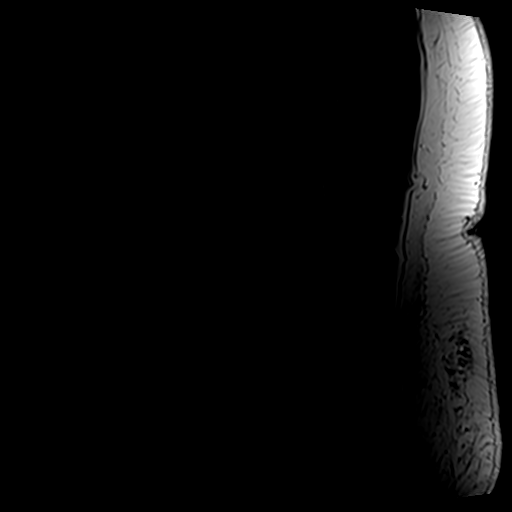
[im 3/14]
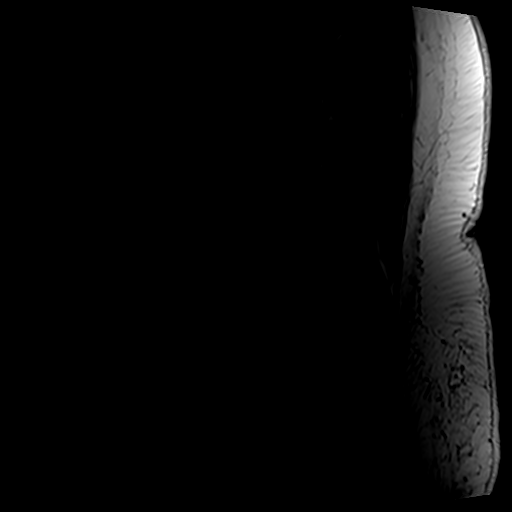
[im 6/14]
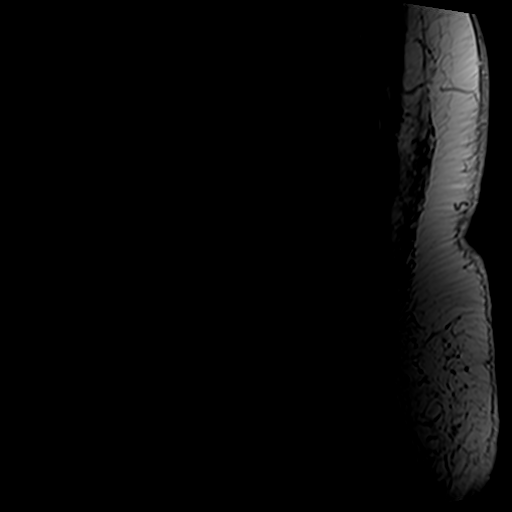
[im 8/14]
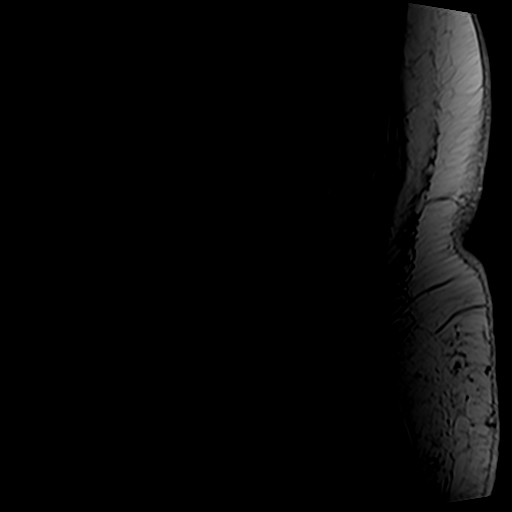
[im 11/14]
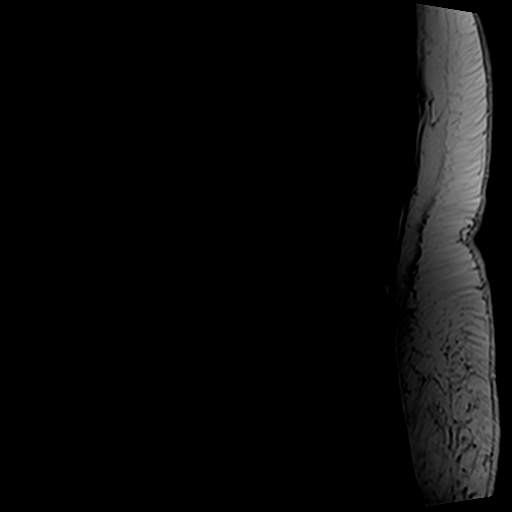
[im 14/14]
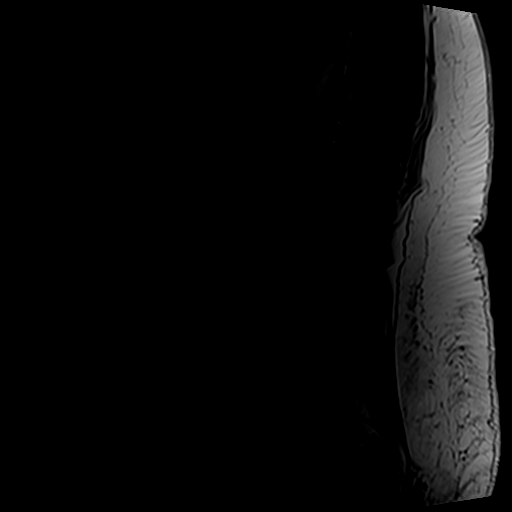

[Series 6: T1 · axial · 4.0mm · 0.35mm/px · z∈[-110,+71]mm · 5 of 38 slices shown (2 of 2)]
[im 1/38]
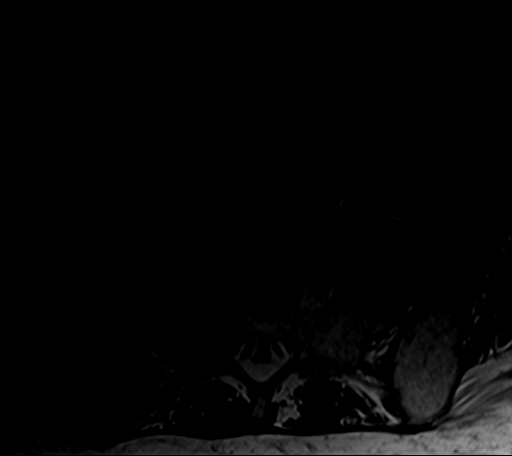
[im 6/38]
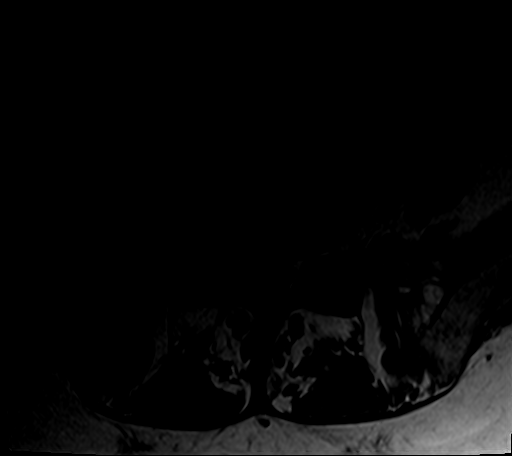
[im 11/38]
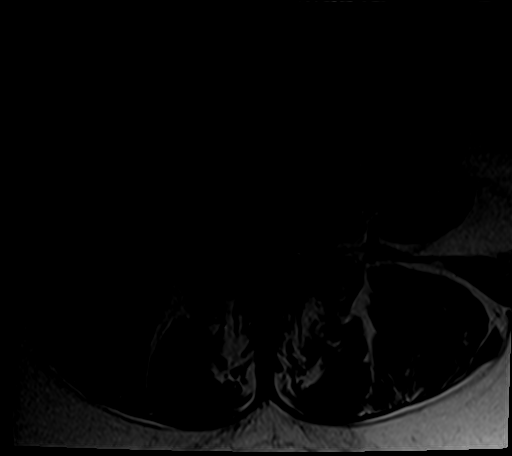
[im 19/38]
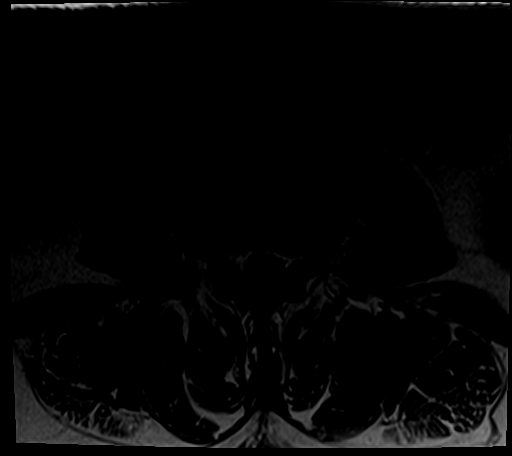
[im 32/38]
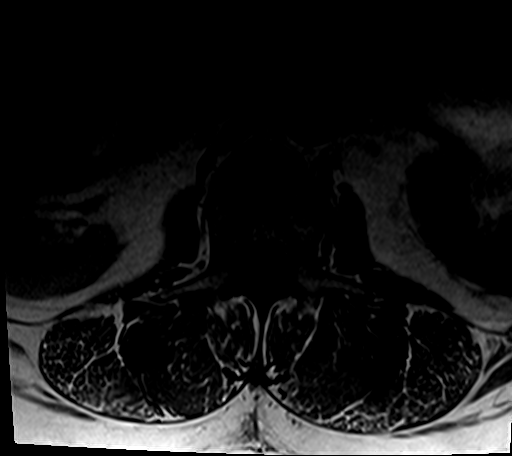

[Series 7: T2 · axial · 4.0mm · 0.70mm/px · z∈[-110,+102]mm · 9 of 38 slices shown]
[im 1/38]
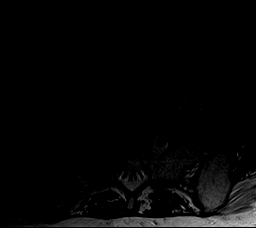
[im 6/38]
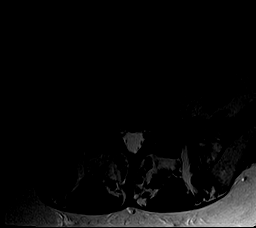
[im 11/38]
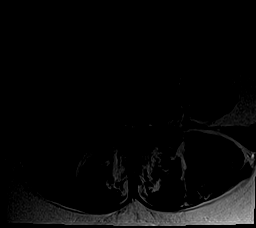
[im 16/38]
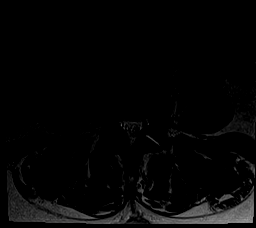
[im 19/38]
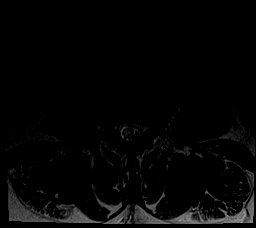
[im 22/38]
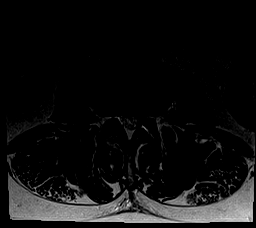
[im 27/38]
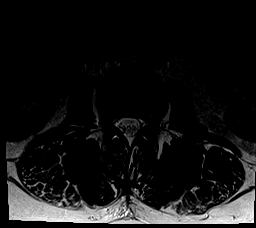
[im 32/38]
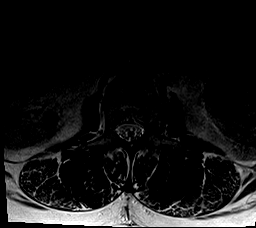
[im 38/38]
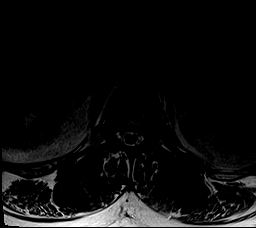

[26 of 48 positions shown; findings below may reference images not displayed]

FINDINGS: Segmentation:  Normal

Alignment: 7 mm anterolisthesis L4-5. Mild retrolisthesis L1-2,
L2-3, L3-4

Vertebrae:  Normal bone marrow.  Negative for fracture or mass.

Conus medullaris and cauda equina: Conus extends to the L1-2 level.
Conus and cauda equina appear normal.

Paraspinal and other soft tissues: Negative for paraspinous mass or
adenopathy

Disc levels:

L1-2: Mild disc and mild facet degeneration. Small left-sided disc
protrusion with mild subarticular stenosis on the left. Mild spinal
stenosis.

L2-3: Diffuse bulging of the disc. Bilateral facet degeneration.
Mild spinal stenosis.

L3-4: Mild disc degeneration with disc bulging and endplate
spurring. Mild facet degeneration. Mild spinal stenosis.

L4-5: 7 mm anterolisthesis with severe facet degeneration. Moderate
to severe spinal stenosis. Moderate subarticular stenosis
bilaterally

L5-S1: Mild facet degeneration bilaterally with mild subarticular
stenosis bilaterally.
IMPRESSION: Mild spinal stenosis L1-2 with small left-sided disc protrusion

Mild spinal stenosis L2-3 and L3-4

Moderate to severe spinal stenosis L4-5. Severe facet degeneration
with grade 1 anterolisthesis.

## 2020-01-08 ENCOUNTER — Ambulatory Visit: Payer: Medicare HMO | Attending: Internal Medicine

## 2020-01-08 DIAGNOSIS — Z20822 Contact with and (suspected) exposure to covid-19: Secondary | ICD-10-CM | POA: Diagnosis not present

## 2020-01-09 LAB — NOVEL CORONAVIRUS, NAA: SARS-CoV-2, NAA: NOT DETECTED

## 2020-02-04 ENCOUNTER — Ambulatory Visit (INDEPENDENT_AMBULATORY_CARE_PROVIDER_SITE_OTHER): Payer: Medicare HMO

## 2020-02-04 ENCOUNTER — Other Ambulatory Visit: Payer: Self-pay

## 2020-02-04 ENCOUNTER — Encounter: Payer: Self-pay | Admitting: Podiatry

## 2020-02-04 ENCOUNTER — Ambulatory Visit: Payer: Medicare HMO | Admitting: Podiatry

## 2020-02-04 VITALS — Temp 97.2°F

## 2020-02-04 DIAGNOSIS — S90852A Superficial foreign body, left foot, initial encounter: Secondary | ICD-10-CM

## 2020-02-04 DIAGNOSIS — E1149 Type 2 diabetes mellitus with other diabetic neurological complication: Secondary | ICD-10-CM

## 2020-02-04 DIAGNOSIS — M79671 Pain in right foot: Secondary | ICD-10-CM | POA: Diagnosis not present

## 2020-02-04 DIAGNOSIS — L84 Corns and callosities: Secondary | ICD-10-CM | POA: Diagnosis not present

## 2020-02-04 MED ORDER — CEPHALEXIN 500 MG PO CAPS: 500.0000 mg | ORAL_CAPSULE | Freq: Three times a day (TID) | ORAL | 0 refills | Status: DC

## 2020-02-08 NOTE — Progress Notes (Signed)
Subjective: 69 year old female presents the office today for concerns of a possible wart on the bottom of her left foot.  This is been ongoing for last 3 weeks.  She said no recent treatment.  She denies any recent injury.  She also has a callus on the left foot digit that trimmed on the side of the toe. Denies any systemic complaints such as fevers, chills, nausea, vomiting. No acute changes since last appointment, and no other complaints at this time.   Objective: AAO x3, NAD DP/PT pulses palpable bilaterally, CRT less than 3 seconds On the left foot submetatarsal 1 is a hyperkeratotic lesion.  Upon debridement is able to identify a small piece of glass which was removed.  This was subcutaneous.  There was a small amount of bleeding and small wound present underneath the foreign body.  There is no surrounding erythema, ascending cellulitis there is no drainage or pus.  No fluctuation crepitation.  Mild hyperkeratotic tissue present medial aspect of first MPJ. No open lesions or pre-ulcerative lesions.  No pain with calf compression, swelling, warmth, erythema  Assessment: Foreign body left foot; callus  Plan: -All treatment options discussed with the patient including all alternatives, risks, complications.  -Debrided the hyperkeratotic lesion medial hallux without any complications or bleeding.  Moisturizer daily. -Upon debridement of the submetatarsal 1 lesion is able to remove a small piece of clear glass which I was able to show  the patient.  No further foreign bodies identified.  X-ray was obtained and reviewed.  No evidence of acute fracture, foreign body or infection noted.  Keflex as a prophylactic.  Antibiotic ointment dressing changes daily. -Patient encouraged to call the office with any questions, concerns, change in symptoms.   Trula Slade DPM

## 2020-02-18 ENCOUNTER — Ambulatory Visit: Payer: Medicare HMO | Admitting: Podiatry

## 2020-02-18 DIAGNOSIS — N183 Chronic kidney disease, stage 3 unspecified: Secondary | ICD-10-CM | POA: Diagnosis not present

## 2020-02-18 DIAGNOSIS — H9190 Unspecified hearing loss, unspecified ear: Secondary | ICD-10-CM | POA: Diagnosis not present

## 2020-02-18 DIAGNOSIS — R69 Illness, unspecified: Secondary | ICD-10-CM | POA: Diagnosis not present

## 2020-02-18 DIAGNOSIS — I10 Essential (primary) hypertension: Secondary | ICD-10-CM | POA: Diagnosis not present

## 2020-02-18 DIAGNOSIS — J45909 Unspecified asthma, uncomplicated: Secondary | ICD-10-CM | POA: Diagnosis not present

## 2020-02-18 DIAGNOSIS — E119 Type 2 diabetes mellitus without complications: Secondary | ICD-10-CM | POA: Diagnosis not present

## 2020-02-18 DIAGNOSIS — E782 Mixed hyperlipidemia: Secondary | ICD-10-CM | POA: Diagnosis not present

## 2020-02-18 DIAGNOSIS — Z0000 Encounter for general adult medical examination without abnormal findings: Secondary | ICD-10-CM | POA: Diagnosis not present

## 2020-04-20 DIAGNOSIS — E1169 Type 2 diabetes mellitus with other specified complication: Secondary | ICD-10-CM | POA: Diagnosis not present

## 2020-04-20 DIAGNOSIS — H409 Unspecified glaucoma: Secondary | ICD-10-CM | POA: Diagnosis not present

## 2020-04-20 DIAGNOSIS — M1990 Unspecified osteoarthritis, unspecified site: Secondary | ICD-10-CM | POA: Diagnosis not present

## 2020-04-20 DIAGNOSIS — E119 Type 2 diabetes mellitus without complications: Secondary | ICD-10-CM | POA: Diagnosis not present

## 2020-04-20 DIAGNOSIS — E782 Mixed hyperlipidemia: Secondary | ICD-10-CM | POA: Diagnosis not present

## 2020-04-20 DIAGNOSIS — N183 Chronic kidney disease, stage 3 unspecified: Secondary | ICD-10-CM | POA: Diagnosis not present

## 2020-04-20 DIAGNOSIS — E1165 Type 2 diabetes mellitus with hyperglycemia: Secondary | ICD-10-CM | POA: Diagnosis not present

## 2020-04-20 DIAGNOSIS — J45909 Unspecified asthma, uncomplicated: Secondary | ICD-10-CM | POA: Diagnosis not present

## 2020-04-20 DIAGNOSIS — I10 Essential (primary) hypertension: Secondary | ICD-10-CM | POA: Diagnosis not present

## 2020-04-28 DIAGNOSIS — H1045 Other chronic allergic conjunctivitis: Secondary | ICD-10-CM | POA: Diagnosis not present

## 2020-04-28 DIAGNOSIS — H40013 Open angle with borderline findings, low risk, bilateral: Secondary | ICD-10-CM | POA: Diagnosis not present

## 2020-05-21 DIAGNOSIS — R69 Illness, unspecified: Secondary | ICD-10-CM | POA: Diagnosis not present

## 2020-05-26 DIAGNOSIS — H903 Sensorineural hearing loss, bilateral: Secondary | ICD-10-CM | POA: Diagnosis not present

## 2020-05-26 DIAGNOSIS — H4010X0 Unspecified open-angle glaucoma, stage unspecified: Secondary | ICD-10-CM | POA: Diagnosis not present

## 2020-05-26 DIAGNOSIS — E119 Type 2 diabetes mellitus without complications: Secondary | ICD-10-CM | POA: Diagnosis not present

## 2020-05-27 DIAGNOSIS — E782 Mixed hyperlipidemia: Secondary | ICD-10-CM | POA: Diagnosis not present

## 2020-05-27 DIAGNOSIS — I10 Essential (primary) hypertension: Secondary | ICD-10-CM | POA: Diagnosis not present

## 2020-05-27 DIAGNOSIS — J45909 Unspecified asthma, uncomplicated: Secondary | ICD-10-CM | POA: Diagnosis not present

## 2020-05-27 DIAGNOSIS — E1169 Type 2 diabetes mellitus with other specified complication: Secondary | ICD-10-CM | POA: Diagnosis not present

## 2020-05-27 DIAGNOSIS — E119 Type 2 diabetes mellitus without complications: Secondary | ICD-10-CM | POA: Diagnosis not present

## 2020-05-27 DIAGNOSIS — M1990 Unspecified osteoarthritis, unspecified site: Secondary | ICD-10-CM | POA: Diagnosis not present

## 2020-05-27 DIAGNOSIS — H409 Unspecified glaucoma: Secondary | ICD-10-CM | POA: Diagnosis not present

## 2020-05-27 DIAGNOSIS — N183 Chronic kidney disease, stage 3 unspecified: Secondary | ICD-10-CM | POA: Diagnosis not present

## 2020-05-27 DIAGNOSIS — E1165 Type 2 diabetes mellitus with hyperglycemia: Secondary | ICD-10-CM | POA: Diagnosis not present

## 2020-06-08 DIAGNOSIS — R69 Illness, unspecified: Secondary | ICD-10-CM | POA: Diagnosis not present

## 2020-06-09 DIAGNOSIS — M48061 Spinal stenosis, lumbar region without neurogenic claudication: Secondary | ICD-10-CM | POA: Diagnosis not present

## 2020-06-21 DIAGNOSIS — E1165 Type 2 diabetes mellitus with hyperglycemia: Secondary | ICD-10-CM | POA: Diagnosis not present

## 2020-06-21 DIAGNOSIS — Z7984 Long term (current) use of oral hypoglycemic drugs: Secondary | ICD-10-CM | POA: Diagnosis not present

## 2020-06-21 DIAGNOSIS — E782 Mixed hyperlipidemia: Secondary | ICD-10-CM | POA: Diagnosis not present

## 2020-06-23 DIAGNOSIS — R69 Illness, unspecified: Secondary | ICD-10-CM | POA: Diagnosis not present

## 2020-06-23 DIAGNOSIS — E119 Type 2 diabetes mellitus without complications: Secondary | ICD-10-CM | POA: Diagnosis not present

## 2020-06-23 DIAGNOSIS — E1165 Type 2 diabetes mellitus with hyperglycemia: Secondary | ICD-10-CM | POA: Diagnosis not present

## 2020-06-23 DIAGNOSIS — N183 Chronic kidney disease, stage 3 unspecified: Secondary | ICD-10-CM | POA: Diagnosis not present

## 2020-06-23 DIAGNOSIS — H409 Unspecified glaucoma: Secondary | ICD-10-CM | POA: Diagnosis not present

## 2020-06-23 DIAGNOSIS — M1990 Unspecified osteoarthritis, unspecified site: Secondary | ICD-10-CM | POA: Diagnosis not present

## 2020-06-23 DIAGNOSIS — J45909 Unspecified asthma, uncomplicated: Secondary | ICD-10-CM | POA: Diagnosis not present

## 2020-06-23 DIAGNOSIS — I10 Essential (primary) hypertension: Secondary | ICD-10-CM | POA: Diagnosis not present

## 2020-06-23 DIAGNOSIS — E1169 Type 2 diabetes mellitus with other specified complication: Secondary | ICD-10-CM | POA: Diagnosis not present

## 2020-06-23 DIAGNOSIS — E782 Mixed hyperlipidemia: Secondary | ICD-10-CM | POA: Diagnosis not present

## 2020-08-11 DIAGNOSIS — R69 Illness, unspecified: Secondary | ICD-10-CM | POA: Diagnosis not present

## 2020-08-18 DIAGNOSIS — N183 Chronic kidney disease, stage 3 unspecified: Secondary | ICD-10-CM | POA: Diagnosis not present

## 2020-08-18 DIAGNOSIS — E782 Mixed hyperlipidemia: Secondary | ICD-10-CM | POA: Diagnosis not present

## 2020-08-18 DIAGNOSIS — I10 Essential (primary) hypertension: Secondary | ICD-10-CM | POA: Diagnosis not present

## 2020-08-18 DIAGNOSIS — J45909 Unspecified asthma, uncomplicated: Secondary | ICD-10-CM | POA: Diagnosis not present

## 2020-08-18 DIAGNOSIS — E1165 Type 2 diabetes mellitus with hyperglycemia: Secondary | ICD-10-CM | POA: Diagnosis not present

## 2020-09-26 DIAGNOSIS — H409 Unspecified glaucoma: Secondary | ICD-10-CM | POA: Diagnosis not present

## 2020-09-26 DIAGNOSIS — N183 Chronic kidney disease, stage 3 unspecified: Secondary | ICD-10-CM | POA: Diagnosis not present

## 2020-09-26 DIAGNOSIS — E1169 Type 2 diabetes mellitus with other specified complication: Secondary | ICD-10-CM | POA: Diagnosis not present

## 2020-09-26 DIAGNOSIS — M1990 Unspecified osteoarthritis, unspecified site: Secondary | ICD-10-CM | POA: Diagnosis not present

## 2020-09-26 DIAGNOSIS — E1165 Type 2 diabetes mellitus with hyperglycemia: Secondary | ICD-10-CM | POA: Diagnosis not present

## 2020-09-26 DIAGNOSIS — I10 Essential (primary) hypertension: Secondary | ICD-10-CM | POA: Diagnosis not present

## 2020-09-26 DIAGNOSIS — E782 Mixed hyperlipidemia: Secondary | ICD-10-CM | POA: Diagnosis not present

## 2020-09-26 DIAGNOSIS — E119 Type 2 diabetes mellitus without complications: Secondary | ICD-10-CM | POA: Diagnosis not present

## 2020-09-26 DIAGNOSIS — J45909 Unspecified asthma, uncomplicated: Secondary | ICD-10-CM | POA: Diagnosis not present

## 2020-10-10 DIAGNOSIS — N183 Chronic kidney disease, stage 3 unspecified: Secondary | ICD-10-CM | POA: Diagnosis not present

## 2020-10-10 DIAGNOSIS — Z7984 Long term (current) use of oral hypoglycemic drugs: Secondary | ICD-10-CM | POA: Diagnosis not present

## 2020-10-10 DIAGNOSIS — E782 Mixed hyperlipidemia: Secondary | ICD-10-CM | POA: Diagnosis not present

## 2020-10-10 DIAGNOSIS — I10 Essential (primary) hypertension: Secondary | ICD-10-CM | POA: Diagnosis not present

## 2020-10-10 DIAGNOSIS — R69 Illness, unspecified: Secondary | ICD-10-CM | POA: Diagnosis not present

## 2020-10-10 DIAGNOSIS — E1165 Type 2 diabetes mellitus with hyperglycemia: Secondary | ICD-10-CM | POA: Diagnosis not present

## 2020-10-17 DIAGNOSIS — Z1231 Encounter for screening mammogram for malignant neoplasm of breast: Secondary | ICD-10-CM | POA: Diagnosis not present

## 2020-10-25 DIAGNOSIS — H04123 Dry eye syndrome of bilateral lacrimal glands: Secondary | ICD-10-CM | POA: Diagnosis not present

## 2020-10-25 DIAGNOSIS — E119 Type 2 diabetes mellitus without complications: Secondary | ICD-10-CM | POA: Diagnosis not present

## 2020-10-25 DIAGNOSIS — H40013 Open angle with borderline findings, low risk, bilateral: Secondary | ICD-10-CM | POA: Diagnosis not present

## 2020-10-25 DIAGNOSIS — H25813 Combined forms of age-related cataract, bilateral: Secondary | ICD-10-CM | POA: Diagnosis not present

## 2020-10-25 DIAGNOSIS — H1045 Other chronic allergic conjunctivitis: Secondary | ICD-10-CM | POA: Diagnosis not present

## 2020-10-27 DIAGNOSIS — E1165 Type 2 diabetes mellitus with hyperglycemia: Secondary | ICD-10-CM | POA: Diagnosis not present

## 2020-10-27 DIAGNOSIS — H409 Unspecified glaucoma: Secondary | ICD-10-CM | POA: Diagnosis not present

## 2020-10-27 DIAGNOSIS — E782 Mixed hyperlipidemia: Secondary | ICD-10-CM | POA: Diagnosis not present

## 2020-10-27 DIAGNOSIS — E119 Type 2 diabetes mellitus without complications: Secondary | ICD-10-CM | POA: Diagnosis not present

## 2020-10-27 DIAGNOSIS — M1990 Unspecified osteoarthritis, unspecified site: Secondary | ICD-10-CM | POA: Diagnosis not present

## 2020-10-27 DIAGNOSIS — E1169 Type 2 diabetes mellitus with other specified complication: Secondary | ICD-10-CM | POA: Diagnosis not present

## 2020-10-27 DIAGNOSIS — J45909 Unspecified asthma, uncomplicated: Secondary | ICD-10-CM | POA: Diagnosis not present

## 2020-10-27 DIAGNOSIS — I10 Essential (primary) hypertension: Secondary | ICD-10-CM | POA: Diagnosis not present

## 2020-10-27 DIAGNOSIS — N183 Chronic kidney disease, stage 3 unspecified: Secondary | ICD-10-CM | POA: Diagnosis not present

## 2020-10-31 DIAGNOSIS — J209 Acute bronchitis, unspecified: Secondary | ICD-10-CM | POA: Diagnosis not present

## 2020-10-31 DIAGNOSIS — R110 Nausea: Secondary | ICD-10-CM | POA: Diagnosis not present

## 2020-11-01 DIAGNOSIS — J209 Acute bronchitis, unspecified: Secondary | ICD-10-CM | POA: Diagnosis not present

## 2020-11-01 DIAGNOSIS — Z20822 Contact with and (suspected) exposure to covid-19: Secondary | ICD-10-CM | POA: Diagnosis not present

## 2020-11-17 DIAGNOSIS — M48062 Spinal stenosis, lumbar region with neurogenic claudication: Secondary | ICD-10-CM | POA: Diagnosis not present

## 2020-11-28 DIAGNOSIS — H409 Unspecified glaucoma: Secondary | ICD-10-CM | POA: Diagnosis not present

## 2020-11-28 DIAGNOSIS — J45909 Unspecified asthma, uncomplicated: Secondary | ICD-10-CM | POA: Diagnosis not present

## 2020-11-28 DIAGNOSIS — I10 Essential (primary) hypertension: Secondary | ICD-10-CM | POA: Diagnosis not present

## 2020-11-28 DIAGNOSIS — M1990 Unspecified osteoarthritis, unspecified site: Secondary | ICD-10-CM | POA: Diagnosis not present

## 2020-11-28 DIAGNOSIS — N183 Chronic kidney disease, stage 3 unspecified: Secondary | ICD-10-CM | POA: Diagnosis not present

## 2020-11-28 DIAGNOSIS — E782 Mixed hyperlipidemia: Secondary | ICD-10-CM | POA: Diagnosis not present

## 2020-11-28 DIAGNOSIS — E1165 Type 2 diabetes mellitus with hyperglycemia: Secondary | ICD-10-CM | POA: Diagnosis not present

## 2020-12-26 DIAGNOSIS — E1165 Type 2 diabetes mellitus with hyperglycemia: Secondary | ICD-10-CM | POA: Diagnosis not present

## 2020-12-26 DIAGNOSIS — M1990 Unspecified osteoarthritis, unspecified site: Secondary | ICD-10-CM | POA: Diagnosis not present

## 2020-12-26 DIAGNOSIS — H409 Unspecified glaucoma: Secondary | ICD-10-CM | POA: Diagnosis not present

## 2020-12-26 DIAGNOSIS — N183 Chronic kidney disease, stage 3 unspecified: Secondary | ICD-10-CM | POA: Diagnosis not present

## 2020-12-26 DIAGNOSIS — I10 Essential (primary) hypertension: Secondary | ICD-10-CM | POA: Diagnosis not present

## 2020-12-26 DIAGNOSIS — E1169 Type 2 diabetes mellitus with other specified complication: Secondary | ICD-10-CM | POA: Diagnosis not present

## 2020-12-26 DIAGNOSIS — E782 Mixed hyperlipidemia: Secondary | ICD-10-CM | POA: Diagnosis not present

## 2020-12-26 DIAGNOSIS — J45909 Unspecified asthma, uncomplicated: Secondary | ICD-10-CM | POA: Diagnosis not present

## 2020-12-26 DIAGNOSIS — E119 Type 2 diabetes mellitus without complications: Secondary | ICD-10-CM | POA: Diagnosis not present

## 2021-01-24 DIAGNOSIS — I10 Essential (primary) hypertension: Secondary | ICD-10-CM | POA: Diagnosis not present

## 2021-01-24 DIAGNOSIS — E1165 Type 2 diabetes mellitus with hyperglycemia: Secondary | ICD-10-CM | POA: Diagnosis not present

## 2021-01-24 DIAGNOSIS — E119 Type 2 diabetes mellitus without complications: Secondary | ICD-10-CM | POA: Diagnosis not present

## 2021-01-24 DIAGNOSIS — E1169 Type 2 diabetes mellitus with other specified complication: Secondary | ICD-10-CM | POA: Diagnosis not present

## 2021-01-24 DIAGNOSIS — J45909 Unspecified asthma, uncomplicated: Secondary | ICD-10-CM | POA: Diagnosis not present

## 2021-01-24 DIAGNOSIS — E782 Mixed hyperlipidemia: Secondary | ICD-10-CM | POA: Diagnosis not present

## 2021-01-24 DIAGNOSIS — M1990 Unspecified osteoarthritis, unspecified site: Secondary | ICD-10-CM | POA: Diagnosis not present

## 2021-01-24 DIAGNOSIS — H409 Unspecified glaucoma: Secondary | ICD-10-CM | POA: Diagnosis not present

## 2021-01-24 DIAGNOSIS — N183 Chronic kidney disease, stage 3 unspecified: Secondary | ICD-10-CM | POA: Diagnosis not present

## 2021-02-21 DIAGNOSIS — E119 Type 2 diabetes mellitus without complications: Secondary | ICD-10-CM | POA: Diagnosis not present

## 2021-02-21 DIAGNOSIS — H409 Unspecified glaucoma: Secondary | ICD-10-CM | POA: Diagnosis not present

## 2021-02-21 DIAGNOSIS — J45909 Unspecified asthma, uncomplicated: Secondary | ICD-10-CM | POA: Diagnosis not present

## 2021-02-21 DIAGNOSIS — N183 Chronic kidney disease, stage 3 unspecified: Secondary | ICD-10-CM | POA: Diagnosis not present

## 2021-02-21 DIAGNOSIS — E1165 Type 2 diabetes mellitus with hyperglycemia: Secondary | ICD-10-CM | POA: Diagnosis not present

## 2021-02-21 DIAGNOSIS — I10 Essential (primary) hypertension: Secondary | ICD-10-CM | POA: Diagnosis not present

## 2021-02-21 DIAGNOSIS — M1990 Unspecified osteoarthritis, unspecified site: Secondary | ICD-10-CM | POA: Diagnosis not present

## 2021-02-21 DIAGNOSIS — E1169 Type 2 diabetes mellitus with other specified complication: Secondary | ICD-10-CM | POA: Diagnosis not present

## 2021-02-21 DIAGNOSIS — E782 Mixed hyperlipidemia: Secondary | ICD-10-CM | POA: Diagnosis not present

## 2021-02-22 DIAGNOSIS — J0100 Acute maxillary sinusitis, unspecified: Secondary | ICD-10-CM | POA: Diagnosis not present

## 2021-03-02 DIAGNOSIS — Z0000 Encounter for general adult medical examination without abnormal findings: Secondary | ICD-10-CM | POA: Diagnosis not present

## 2021-03-02 DIAGNOSIS — N183 Chronic kidney disease, stage 3 unspecified: Secondary | ICD-10-CM | POA: Diagnosis not present

## 2021-03-02 DIAGNOSIS — Z23 Encounter for immunization: Secondary | ICD-10-CM | POA: Diagnosis not present

## 2021-03-02 DIAGNOSIS — E782 Mixed hyperlipidemia: Secondary | ICD-10-CM | POA: Diagnosis not present

## 2021-03-02 DIAGNOSIS — E119 Type 2 diabetes mellitus without complications: Secondary | ICD-10-CM | POA: Diagnosis not present

## 2021-03-02 DIAGNOSIS — R8281 Pyuria: Secondary | ICD-10-CM | POA: Diagnosis not present

## 2021-03-02 DIAGNOSIS — I10 Essential (primary) hypertension: Secondary | ICD-10-CM | POA: Diagnosis not present

## 2021-03-21 ENCOUNTER — Encounter: Admit: 2021-03-21 | Payer: PRIVATE HEALTH INSURANCE | Attending: Internal Medicine | Primary: Internal Medicine

## 2021-03-28 DIAGNOSIS — N183 Chronic kidney disease, stage 3 unspecified: Secondary | ICD-10-CM | POA: Diagnosis not present

## 2021-03-28 DIAGNOSIS — H409 Unspecified glaucoma: Secondary | ICD-10-CM | POA: Diagnosis not present

## 2021-03-28 DIAGNOSIS — E1169 Type 2 diabetes mellitus with other specified complication: Secondary | ICD-10-CM | POA: Diagnosis not present

## 2021-03-28 DIAGNOSIS — E782 Mixed hyperlipidemia: Secondary | ICD-10-CM | POA: Diagnosis not present

## 2021-03-28 DIAGNOSIS — E1165 Type 2 diabetes mellitus with hyperglycemia: Secondary | ICD-10-CM | POA: Diagnosis not present

## 2021-03-28 DIAGNOSIS — J45909 Unspecified asthma, uncomplicated: Secondary | ICD-10-CM | POA: Diagnosis not present

## 2021-03-28 DIAGNOSIS — E119 Type 2 diabetes mellitus without complications: Secondary | ICD-10-CM | POA: Diagnosis not present

## 2021-03-28 DIAGNOSIS — M1990 Unspecified osteoarthritis, unspecified site: Secondary | ICD-10-CM | POA: Diagnosis not present

## 2021-03-28 DIAGNOSIS — I10 Essential (primary) hypertension: Secondary | ICD-10-CM | POA: Diagnosis not present

## 2021-04-07 DIAGNOSIS — D538 Other specified nutritional anemias: Secondary | ICD-10-CM | POA: Diagnosis not present

## 2021-04-10 DIAGNOSIS — Z79899 Other long term (current) drug therapy: Secondary | ICD-10-CM | POA: Diagnosis not present

## 2021-04-10 DIAGNOSIS — E1169 Type 2 diabetes mellitus with other specified complication: Secondary | ICD-10-CM | POA: Diagnosis not present

## 2021-04-10 DIAGNOSIS — D538 Other specified nutritional anemias: Secondary | ICD-10-CM | POA: Diagnosis not present

## 2021-04-19 DIAGNOSIS — M1990 Unspecified osteoarthritis, unspecified site: Secondary | ICD-10-CM | POA: Diagnosis not present

## 2021-04-19 DIAGNOSIS — N183 Chronic kidney disease, stage 3 unspecified: Secondary | ICD-10-CM | POA: Diagnosis not present

## 2021-04-19 DIAGNOSIS — E1165 Type 2 diabetes mellitus with hyperglycemia: Secondary | ICD-10-CM | POA: Diagnosis not present

## 2021-04-19 DIAGNOSIS — H409 Unspecified glaucoma: Secondary | ICD-10-CM | POA: Diagnosis not present

## 2021-04-19 DIAGNOSIS — E1169 Type 2 diabetes mellitus with other specified complication: Secondary | ICD-10-CM | POA: Diagnosis not present

## 2021-04-19 DIAGNOSIS — J45909 Unspecified asthma, uncomplicated: Secondary | ICD-10-CM | POA: Diagnosis not present

## 2021-04-19 DIAGNOSIS — E782 Mixed hyperlipidemia: Secondary | ICD-10-CM | POA: Diagnosis not present

## 2021-04-19 DIAGNOSIS — I10 Essential (primary) hypertension: Secondary | ICD-10-CM | POA: Diagnosis not present

## 2021-04-24 DIAGNOSIS — S0502XA Injury of conjunctiva and corneal abrasion without foreign body, left eye, initial encounter: Secondary | ICD-10-CM | POA: Diagnosis not present

## 2021-05-15 ENCOUNTER — Other Ambulatory Visit: Payer: Self-pay

## 2021-05-15 ENCOUNTER — Ambulatory Visit: Payer: Medicare HMO | Admitting: Podiatry

## 2021-05-15 ENCOUNTER — Encounter: Payer: Self-pay | Admitting: Podiatry

## 2021-05-15 DIAGNOSIS — E1149 Type 2 diabetes mellitus with other diabetic neurological complication: Secondary | ICD-10-CM | POA: Diagnosis not present

## 2021-05-15 DIAGNOSIS — L84 Corns and callosities: Secondary | ICD-10-CM

## 2021-05-15 NOTE — Patient Instructions (Signed)

## 2021-05-17 NOTE — Progress Notes (Signed)
Subjective: 70 year old female presents the office today for concerns of calluses on the ball of her feet as well as the side of her big toes.  Denies any opening, swelling or redness or any drainage.  She does have neuropathy.  She is diabetic and last A1c was 6.2. Denies any systemic complaints such as fevers, chills, nausea, vomiting. No acute changes since last appointment, and no other complaints at this time.   Objective: AAO x3, NAD DP/PT pulses palpable bilaterally, CRT less than 3 seconds Hyperkeratotic tissue submetatarsal bilaterally as well as medial hallux.  No underlying ulceration drainage or any signs of infection.  No open lesions or pre-ulcerative lesions.  No pain with calf compression, swelling, warmth, erythema  Assessment: Hyperkeratotic lesions  Plan: -All treatment options discussed with the patient including all alternatives, risks, complications.  -Debrided hyperkeratotic lesions x2 without any complications or bleeding -Discussed moisturizer daily. -Daily foot inspection -Patient encouraged to call the office with any questions, concerns, change in symptoms.   Trula Slade DPM

## 2021-05-18 ENCOUNTER — Ambulatory Visit: Payer: Medicare HMO | Admitting: Podiatry

## 2021-05-18 DIAGNOSIS — M48061 Spinal stenosis, lumbar region without neurogenic claudication: Secondary | ICD-10-CM | POA: Diagnosis not present

## 2021-05-22 DIAGNOSIS — E119 Type 2 diabetes mellitus without complications: Secondary | ICD-10-CM | POA: Diagnosis not present

## 2021-05-22 DIAGNOSIS — J45909 Unspecified asthma, uncomplicated: Secondary | ICD-10-CM | POA: Diagnosis not present

## 2021-05-22 DIAGNOSIS — N183 Chronic kidney disease, stage 3 unspecified: Secondary | ICD-10-CM | POA: Diagnosis not present

## 2021-05-22 DIAGNOSIS — H409 Unspecified glaucoma: Secondary | ICD-10-CM | POA: Diagnosis not present

## 2021-05-22 DIAGNOSIS — M1990 Unspecified osteoarthritis, unspecified site: Secondary | ICD-10-CM | POA: Diagnosis not present

## 2021-05-22 DIAGNOSIS — E782 Mixed hyperlipidemia: Secondary | ICD-10-CM | POA: Diagnosis not present

## 2021-05-22 DIAGNOSIS — E1165 Type 2 diabetes mellitus with hyperglycemia: Secondary | ICD-10-CM | POA: Diagnosis not present

## 2021-05-22 DIAGNOSIS — I10 Essential (primary) hypertension: Secondary | ICD-10-CM | POA: Diagnosis not present

## 2021-06-20 DIAGNOSIS — J45909 Unspecified asthma, uncomplicated: Secondary | ICD-10-CM | POA: Diagnosis not present

## 2021-06-20 DIAGNOSIS — E1165 Type 2 diabetes mellitus with hyperglycemia: Secondary | ICD-10-CM | POA: Diagnosis not present

## 2021-06-20 DIAGNOSIS — I10 Essential (primary) hypertension: Secondary | ICD-10-CM | POA: Diagnosis not present

## 2021-06-20 DIAGNOSIS — N183 Chronic kidney disease, stage 3 unspecified: Secondary | ICD-10-CM | POA: Diagnosis not present

## 2021-06-20 DIAGNOSIS — H409 Unspecified glaucoma: Secondary | ICD-10-CM | POA: Diagnosis not present

## 2021-06-20 DIAGNOSIS — M1990 Unspecified osteoarthritis, unspecified site: Secondary | ICD-10-CM | POA: Diagnosis not present

## 2021-06-20 DIAGNOSIS — E119 Type 2 diabetes mellitus without complications: Secondary | ICD-10-CM | POA: Diagnosis not present

## 2021-06-20 DIAGNOSIS — E1169 Type 2 diabetes mellitus with other specified complication: Secondary | ICD-10-CM | POA: Diagnosis not present

## 2021-06-20 DIAGNOSIS — E782 Mixed hyperlipidemia: Secondary | ICD-10-CM | POA: Diagnosis not present

## 2021-07-19 DIAGNOSIS — N183 Chronic kidney disease, stage 3 unspecified: Secondary | ICD-10-CM | POA: Diagnosis not present

## 2021-07-19 DIAGNOSIS — H409 Unspecified glaucoma: Secondary | ICD-10-CM | POA: Diagnosis not present

## 2021-07-19 DIAGNOSIS — J45909 Unspecified asthma, uncomplicated: Secondary | ICD-10-CM | POA: Diagnosis not present

## 2021-07-19 DIAGNOSIS — E1169 Type 2 diabetes mellitus with other specified complication: Secondary | ICD-10-CM | POA: Diagnosis not present

## 2021-07-19 DIAGNOSIS — E1165 Type 2 diabetes mellitus with hyperglycemia: Secondary | ICD-10-CM | POA: Diagnosis not present

## 2021-07-19 DIAGNOSIS — E782 Mixed hyperlipidemia: Secondary | ICD-10-CM | POA: Diagnosis not present

## 2021-07-19 DIAGNOSIS — I10 Essential (primary) hypertension: Secondary | ICD-10-CM | POA: Diagnosis not present

## 2021-07-19 DIAGNOSIS — E119 Type 2 diabetes mellitus without complications: Secondary | ICD-10-CM | POA: Diagnosis not present

## 2021-07-19 DIAGNOSIS — M1990 Unspecified osteoarthritis, unspecified site: Secondary | ICD-10-CM | POA: Diagnosis not present

## 2021-08-10 ENCOUNTER — Encounter: Admit: 2021-08-10 | Payer: PRIVATE HEALTH INSURANCE | Attending: Dermatology | Primary: Internal Medicine

## 2021-08-10 ENCOUNTER — Ambulatory Visit: Admit: 2021-08-10 | Payer: PRIVATE HEALTH INSURANCE | Attending: Dermatology | Primary: Internal Medicine

## 2021-08-10 DIAGNOSIS — R569 Unspecified convulsions: Secondary | ICD-10-CM

## 2021-08-10 DIAGNOSIS — D485 Neoplasm of uncertain behavior of skin: Secondary | ICD-10-CM

## 2021-08-10 DIAGNOSIS — E785 Hyperlipidemia, unspecified: Secondary | ICD-10-CM

## 2021-08-10 MED ORDER — LEVETIRACETAM 1,000 MG/100 ML IN SODIUM CHLORIDE(ISO-OSM) IV PIGGYBACK
1000 mg/100 mL | Status: AC
Start: 2021-08-10 — End: ?

## 2021-08-10 MED ORDER — ROSUVASTATIN 5 MG TABLET
5 mg | ORAL | Status: AC
Start: 2021-08-10 — End: ?

## 2021-08-10 MED ORDER — ALENDRONATE ORAL
Status: AC
Start: 2021-08-10 — End: ?

## 2021-08-10 NOTE — Patient Instructions
[facility] [place]STRUCTIONS FOR WOUND CARE AFTER A SKIN BIOPSYYour doctor has performed a skin biopsy by removing a small sample of skin. To ensure a well healed wound, these instructions should be followed. Remove the Band-Aid the following morning.Cleanse the biopsy site with soap and water and pat dry.  Then apply a small dab of Vaseline and cover with a new Band-Aid.Do not let a scab form.  Do not leave the wound open to air. This will delay healing.Continue wound care until healed.  The healing process can take 1 to 4 weeks.  It is normal for biopsies on the legs to take longer to heal.If you have any problems or questions, please call [phone] RESULTSYou should receive notification of your biopsy results by letter within 14 working days.  If for some reason you do not get notification, please call the office and a nurse will get back to you promptly.

## 2021-08-11 NOTE — Other
Benign pls inform pt

## 2021-08-14 NOTE — Other
Spoke with pt and notified her of benign result.

## 2021-08-15 DIAGNOSIS — H409 Unspecified glaucoma: Secondary | ICD-10-CM | POA: Diagnosis not present

## 2021-08-15 DIAGNOSIS — G8929 Other chronic pain: Secondary | ICD-10-CM | POA: Diagnosis not present

## 2021-08-15 DIAGNOSIS — E782 Mixed hyperlipidemia: Secondary | ICD-10-CM | POA: Diagnosis not present

## 2021-08-15 DIAGNOSIS — J45909 Unspecified asthma, uncomplicated: Secondary | ICD-10-CM | POA: Diagnosis not present

## 2021-08-15 DIAGNOSIS — E1169 Type 2 diabetes mellitus with other specified complication: Secondary | ICD-10-CM | POA: Diagnosis not present

## 2021-08-15 DIAGNOSIS — M1990 Unspecified osteoarthritis, unspecified site: Secondary | ICD-10-CM | POA: Diagnosis not present

## 2021-08-15 DIAGNOSIS — I10 Essential (primary) hypertension: Secondary | ICD-10-CM | POA: Diagnosis not present

## 2021-08-15 DIAGNOSIS — E119 Type 2 diabetes mellitus without complications: Secondary | ICD-10-CM | POA: Diagnosis not present

## 2021-08-15 DIAGNOSIS — E1165 Type 2 diabetes mellitus with hyperglycemia: Secondary | ICD-10-CM | POA: Diagnosis not present

## 2021-08-15 DIAGNOSIS — N183 Chronic kidney disease, stage 3 unspecified: Secondary | ICD-10-CM | POA: Diagnosis not present

## 2021-08-18 ENCOUNTER — Encounter: Admit: 2021-08-18 | Payer: PRIVATE HEALTH INSURANCE | Attending: Dermatology | Primary: Internal Medicine

## 2021-08-18 DIAGNOSIS — E785 Hyperlipidemia, unspecified: Secondary | ICD-10-CM

## 2021-08-18 DIAGNOSIS — R569 Unspecified convulsions: Secondary | ICD-10-CM

## 2021-08-18 NOTE — Progress Notes
ROS:  Denies headache, fevers, chills, night sweats, vision changes, dyspnea, cough, abdominal pain, nausea, vomiting, diarrhea, urinary changes, myalgias, arthralgias, weakness, weight loss, bone pain, paresthesias, numbness, swelling.

## 2021-08-18 NOTE — Progress Notes
Reason for referral: skin lesionReferred by:  Dr. Pauline Good, MDCC: Skin Lesion (1. LOC on L knuckle surfaced a few years ago no pain no drainage same size  )Physician documented HPI: 70 y.o. female presents for evaluation and treatment of a skin lesion.She complains of an LOC on the L knuckle that surfaced a few years ago. She denies pain, drainage, and change in size.ROS:  Denies headache, fevers, chills, night sweats, vision changes, dyspnea, cough, abdominal pain, nausea, vomiting, diarrhea, urinary changes, myalgias, arthralgias, weakness, weight loss, bone pain, paresthesias, numbness, swelling.PMH: Past Medical History: Diagnosis Date ? Hyperlipidemia  ? Seizures (HC Code) (HC CODE) (HC Code)  FH: History reviewed. No pertinent family history.Allergies: No Known AllergiesMeds: Current Outpatient Medications on File Prior to Visit Medication Sig Dispense Refill ? alendronate sodium (ALENDRONATE ORAL)    ? levETIRAcetam (KEPPRA) 1,000 mg/100 mL    ? rosuvastatin (CRESTOR) 5 mg tablet Take 5 mg by mouth.   No current facility-administered medications on file prior to visit. Exam:?	GEN: well appearing, NAD?	L dorsal hand over 3rd MCP: 7mm firm hyperkeratotic dome shaped papule, hyperkeratotic base is about 1 cmKey: M=macule, P=papule, N=nodule, PQ=plaque,  EP=eczematous patch, L=lichenified, V=verrucous, OC=open comedone, CC=closed comedone, C=clearAssessment/Recommendations:#Neoplasm of uncertain behavior, L dorsal hand- shave biopsy- DDX: poroma vs other adnexal neoplasm- Skin Biopsy Procedure NoteBiopsy(ies) were recommended and performed.Risks, including scarring, bleeding and infection, as well as benefits were explained to the patient. Informed consent (verbal and written) was obtained and written consent placed in the chart.The skin was prepped with alcohol. Lidocaine 1% with epinephrine 1:100,000 was injected for local anesthesia. Biopsy performed per routine. Hemostasis was achieved with aluminum chloride 20%, electrodessication, or heat/electrocautery, sutures or Gelfoam  if indicated. Vaseline was applied and then wound covered with a Bandaid with or without additional gauze as a dressing. Wound instructions were reviewed verbally and supplied in written form.Return: Return if symptoms worsen or fail to improve.10/13/202211:24 Quintin Alto, MD PhD [facility] - DermatologyScribed for Algis Downs, MD PhD by Cordelia Pen, medical scribe August 10, 2021  The documentation recorded by the scribe accurately reflects the services I personally performed and the decisions made by me. I reviewed and confirmed all material entered and/or pre-charted by the scribe.

## 2021-08-21 DIAGNOSIS — M48062 Spinal stenosis, lumbar region with neurogenic claudication: Secondary | ICD-10-CM | POA: Diagnosis not present

## 2021-09-01 DIAGNOSIS — M5442 Lumbago with sciatica, left side: Secondary | ICD-10-CM | POA: Diagnosis not present

## 2021-09-01 DIAGNOSIS — Z23 Encounter for immunization: Secondary | ICD-10-CM | POA: Diagnosis not present

## 2021-09-01 DIAGNOSIS — R69 Illness, unspecified: Secondary | ICD-10-CM | POA: Diagnosis not present

## 2021-09-01 DIAGNOSIS — D6489 Other specified anemias: Secondary | ICD-10-CM | POA: Diagnosis not present

## 2021-09-01 DIAGNOSIS — N183 Chronic kidney disease, stage 3 unspecified: Secondary | ICD-10-CM | POA: Diagnosis not present

## 2021-09-01 DIAGNOSIS — G8929 Other chronic pain: Secondary | ICD-10-CM | POA: Diagnosis not present

## 2021-09-01 DIAGNOSIS — E119 Type 2 diabetes mellitus without complications: Secondary | ICD-10-CM | POA: Diagnosis not present

## 2021-09-01 DIAGNOSIS — I10 Essential (primary) hypertension: Secondary | ICD-10-CM | POA: Diagnosis not present

## 2021-09-01 DIAGNOSIS — E782 Mixed hyperlipidemia: Secondary | ICD-10-CM | POA: Diagnosis not present

## 2021-09-19 DIAGNOSIS — E782 Mixed hyperlipidemia: Secondary | ICD-10-CM | POA: Diagnosis not present

## 2021-09-19 DIAGNOSIS — G8929 Other chronic pain: Secondary | ICD-10-CM | POA: Diagnosis not present

## 2021-09-19 DIAGNOSIS — E1169 Type 2 diabetes mellitus with other specified complication: Secondary | ICD-10-CM | POA: Diagnosis not present

## 2021-09-19 DIAGNOSIS — M1990 Unspecified osteoarthritis, unspecified site: Secondary | ICD-10-CM | POA: Diagnosis not present

## 2021-09-19 DIAGNOSIS — E1165 Type 2 diabetes mellitus with hyperglycemia: Secondary | ICD-10-CM | POA: Diagnosis not present

## 2021-09-19 DIAGNOSIS — J45909 Unspecified asthma, uncomplicated: Secondary | ICD-10-CM | POA: Diagnosis not present

## 2021-09-19 DIAGNOSIS — H409 Unspecified glaucoma: Secondary | ICD-10-CM | POA: Diagnosis not present

## 2021-09-19 DIAGNOSIS — N183 Chronic kidney disease, stage 3 unspecified: Secondary | ICD-10-CM | POA: Diagnosis not present

## 2021-09-19 DIAGNOSIS — E119 Type 2 diabetes mellitus without complications: Secondary | ICD-10-CM | POA: Diagnosis not present

## 2021-09-19 DIAGNOSIS — M48062 Spinal stenosis, lumbar region with neurogenic claudication: Secondary | ICD-10-CM | POA: Diagnosis not present

## 2021-09-19 DIAGNOSIS — I10 Essential (primary) hypertension: Secondary | ICD-10-CM | POA: Diagnosis not present

## 2021-10-12 DIAGNOSIS — N183 Chronic kidney disease, stage 3 unspecified: Secondary | ICD-10-CM | POA: Diagnosis not present

## 2021-10-12 DIAGNOSIS — E782 Mixed hyperlipidemia: Secondary | ICD-10-CM | POA: Diagnosis not present

## 2021-10-12 DIAGNOSIS — E1169 Type 2 diabetes mellitus with other specified complication: Secondary | ICD-10-CM | POA: Diagnosis not present

## 2021-10-12 DIAGNOSIS — G8929 Other chronic pain: Secondary | ICD-10-CM | POA: Diagnosis not present

## 2021-10-12 DIAGNOSIS — M1990 Unspecified osteoarthritis, unspecified site: Secondary | ICD-10-CM | POA: Diagnosis not present

## 2021-10-12 DIAGNOSIS — E1165 Type 2 diabetes mellitus with hyperglycemia: Secondary | ICD-10-CM | POA: Diagnosis not present

## 2021-10-12 DIAGNOSIS — J45909 Unspecified asthma, uncomplicated: Secondary | ICD-10-CM | POA: Diagnosis not present

## 2021-10-12 DIAGNOSIS — I10 Essential (primary) hypertension: Secondary | ICD-10-CM | POA: Diagnosis not present

## 2021-10-19 DIAGNOSIS — M545 Low back pain, unspecified: Secondary | ICD-10-CM | POA: Diagnosis not present

## 2021-10-24 DIAGNOSIS — M48062 Spinal stenosis, lumbar region with neurogenic claudication: Secondary | ICD-10-CM | POA: Diagnosis not present

## 2021-11-15 ENCOUNTER — Other Ambulatory Visit: Payer: Self-pay | Admitting: Orthopedic Surgery

## 2021-11-23 DIAGNOSIS — I10 Essential (primary) hypertension: Secondary | ICD-10-CM | POA: Diagnosis not present

## 2021-11-23 DIAGNOSIS — E119 Type 2 diabetes mellitus without complications: Secondary | ICD-10-CM | POA: Diagnosis not present

## 2021-11-23 DIAGNOSIS — E1169 Type 2 diabetes mellitus with other specified complication: Secondary | ICD-10-CM | POA: Diagnosis not present

## 2021-11-23 DIAGNOSIS — G8929 Other chronic pain: Secondary | ICD-10-CM | POA: Diagnosis not present

## 2021-11-23 DIAGNOSIS — N183 Chronic kidney disease, stage 3 unspecified: Secondary | ICD-10-CM | POA: Diagnosis not present

## 2021-11-23 DIAGNOSIS — E1165 Type 2 diabetes mellitus with hyperglycemia: Secondary | ICD-10-CM | POA: Diagnosis not present

## 2021-11-23 DIAGNOSIS — E782 Mixed hyperlipidemia: Secondary | ICD-10-CM | POA: Diagnosis not present

## 2021-12-12 DIAGNOSIS — G8929 Other chronic pain: Secondary | ICD-10-CM | POA: Diagnosis not present

## 2021-12-12 DIAGNOSIS — E782 Mixed hyperlipidemia: Secondary | ICD-10-CM | POA: Diagnosis not present

## 2021-12-12 DIAGNOSIS — I10 Essential (primary) hypertension: Secondary | ICD-10-CM | POA: Diagnosis not present

## 2021-12-12 DIAGNOSIS — E1165 Type 2 diabetes mellitus with hyperglycemia: Secondary | ICD-10-CM | POA: Diagnosis not present

## 2022-01-09 DIAGNOSIS — E782 Mixed hyperlipidemia: Secondary | ICD-10-CM | POA: Diagnosis not present

## 2022-01-09 DIAGNOSIS — I10 Essential (primary) hypertension: Secondary | ICD-10-CM | POA: Diagnosis not present

## 2022-01-09 DIAGNOSIS — E1165 Type 2 diabetes mellitus with hyperglycemia: Secondary | ICD-10-CM | POA: Diagnosis not present

## 2022-01-09 DIAGNOSIS — N183 Chronic kidney disease, stage 3 unspecified: Secondary | ICD-10-CM | POA: Diagnosis not present

## 2022-01-16 NOTE — Pre-Procedure Instructions (Signed)
Surgical Instructions ? ? ? Your procedure is scheduled on Wednesday, March 29th. ? Report to [facility] Main Entrance "A" at 5:30 A.M., then check in with the Admitting office. ? Call this number if you have problems the morning of surgery: ? [phone] ? ? If you have any questions prior to your surgery date call [phone]: Open Monday-Friday 8am-4pm ? ? ? Remember: ? Do not eat after midnight the night before your surgery ? ?You may drink clear liquids until 4:30 a.m. the morning of your surgery.   ?Clear liquids allowed are: Water, Non-Citrus Juices (without pulp), Carbonated Beverages, Clear Tea, Black Coffee ONLY (NO MILK, CREAM OR POWDERED CREAMER of any kind), and Gatorade. ? ? ?Enhanced Recovery after Surgery for Orthopedics ?Enhanced Recovery after Surgery is a protocol used to improve the stress on your body and your recovery after surgery. ? ?Patient Instructions ? ?The day of surgery (if you have diabetes): ? ?Drink ONE small 12 oz bottle of Gatorade G2 by 4:30 am the morning of surgery ?This bottle was given to you during your hospital  ?pre-op appointment visit.  ?Nothing else to drink after completing the  ?Small 12 oz bottle of Gatorade G2. ? ?       If you have questions, please contact your surgeon?s office. ? ?  ? Take these medicines the morning of surgery with A SIP OF WATER:  ?citalopram (CELEXA)  ?fexofenadine (ALLEGRA)  ?gabapentin (NEURONTIN)  ?methocarbamol (ROBAXIN)  ?rosuvastatin (CRESTOR) ? ?As needed: ?Albuterol Inhaler-please bring with you to the hospital.  ?budesonide-formoterol (SYMBICORT)-please bring with you to the hospital.  ? ?As of today, STOP taking any Aspirin (unless otherwise instructed by your surgeon) Aleve, Naproxen, Ibuprofen, Motrin, Advil, Goody's, BC's, all herbal medications, fish oil, and all vitamins. ? ?WHAT DO I DO ABOUT MY DIABETES MEDICATION? ? ? ?Do not take metFORMIN (GLUCOPHAGE) the morning of surgery. ? ? ?HOW TO MANAGE YOUR DIABETES ?BEFORE AND  AFTER SURGERY ? ?Why is it important to control my blood sugar before and after surgery? ?Improving blood sugar levels before and after surgery helps healing and can limit problems. ?A way of improving blood sugar control is eating a healthy diet by: ? Eating less sugar and carbohydrates ? Increasing activity/exercise ? Talking with your doctor about reaching your blood sugar goals ?High blood sugars (greater than 180 mg/dL) can raise your risk of infections and slow your recovery, so you will need to focus on controlling your diabetes during the weeks before surgery. ?Make sure that the doctor who takes care of your diabetes knows about your planned surgery including the date and location. ? ?How do I manage my blood sugar before surgery? ?Check your blood sugar at least 4 times a day, starting 2 days before surgery, to make sure that the level is not too high or low. ? ?Check your blood sugar the morning of your surgery when you wake up and every 2 hours until you get to the Short Stay unit. ? ?If your blood sugar is less than 70 mg/dL, you will need to treat for low blood sugar: ?Do not take insulin. ?Treat a low blood sugar (less than 70 mg/dL) with ? cup of clear juice (cranberry or apple), 4 glucose tablets, OR glucose gel. ?Recheck blood sugar in 15 minutes after treatment (to make sure it is greater than 70 mg/dL). If your blood sugar is not greater than 70 mg/dL on recheck, call [phone] for further instructions. ?Report your blood sugar to the  short stay nurse when you get to Short Stay. ? ?If you are admitted to the hospital after surgery: ?Your blood sugar will be checked by the staff and you will probably be given insulin after surgery (instead of oral diabetes medicines) to make sure you have good blood sugar levels. ?The goal for blood sugar control after surgery is 80-180 mg/dL. ? ? ?         ?Do not wear jewelry or makeup ?Do not wear lotions, powders, perfumes, or deodorant. ?Do not shave 48  hours prior to surgery.  ?Do not bring valuables to the hospital. ?Do not wear nail polish, gel polish, artificial nails, or any other type of covering on natural nails (fingers and toes) ?If you have artificial nails or gel coating that need to be removed by a nail salon, please have this removed prior to surgery. Artificial nails or gel coating may interfere with anesthesia's ability to adequately monitor your vital signs. ? ?[facility] is not responsible for any belongings or valuables. .  ? ?Do NOT Smoke (Tobacco/Vaping)  24 hours prior to your procedure ? ?If you use a CPAP at night, you may bring your mask for your overnight stay. ?  ?Contacts, glasses, hearing aids, dentures or partials may not be worn into surgery, please bring cases for these belongings ?  ?For patients admitted to the hospital, discharge time will be determined by your treatment team. ?  ?Patients discharged the day of surgery will not be allowed to drive home, and someone needs to stay with them for 24 hours. ? ?NO VISITORS WILL BE ALLOWED IN PRE-OP WHERE PATIENTS ARE PREPPED FOR SURGERY.  YOU MAY HAVE TWO (2) PEOPLE IN THE WAITING ROOM WHILE YOU ARE IN SURGERY.  IF YOU ARE TO BE ADMITTED, ONCE YOU ARE IN YOUR ROOM YOU WILL BE ALLOWED TWO (2) VISITORS. 1 (ONE) VISITOR MAY STAY OVERNIGHT BUT MUST ARRIVE TO THE ROOM BY 8pm.  Minor children may have two parents present. Special consideration for safety and communication needs will be reviewed on a case by case basis. ? ?Special instructions:   ? ?Oral Hygiene is also important to reduce your risk of infection.  Remember - BRUSH YOUR TEETH THE MORNING OF SURGERY WITH YOUR REGULAR TOOTHPASTE ? ? ?[place] [facility]- Preparing For Surgery ? ?Before surgery, you can play an important role. Because skin is not sterile, your skin needs to be as free of germs as possible. You can reduce the number of germs on your skin by washing with CHG (chlorahexidine gluconate) Soap before surgery.  CHG is an  antiseptic cleaner which kills germs and bonds with the skin to continue killing germs even after washing.   ? ? ?Please do not use if you have an allergy to CHG or antibacterial soaps. If your skin becomes reddened/irritated stop using the CHG.  ?Do not shave (including legs and underarms) for at least 48 hours prior to first CHG shower. It is OK to shave your face. ? ?Please follow these instructions carefully. ?  ? ? Shower the NIGHT BEFORE SURGERY and the MORNING OF SURGERY with CHG Soap.  ? If you chose to wash your hair, wash your hair first as usual with your normal shampoo. After you shampoo, rinse your hair and body thoroughly to remove the shampoo.  Then [employer] and genitals (private parts) with your normal soap and rinse thoroughly to remove soap. ? ?After that Use CHG Soap as you would any other liquid soap.  You can apply CHG directly to the skin and wash gently with a scrungie or a clean washcloth.  ? ?Apply the CHG Soap to your body ONLY FROM THE NECK DOWN.  Do not use on open wounds or open sores. Avoid contact with your eyes, ears, mouth and genitals (private parts). Wash Face and genitals (private parts)  with your normal soap.  ? ?Wash thoroughly, paying special attention to the area where your surgery will be performed. ? ?Thoroughly rinse your body with warm water from the neck down. ? ?DO NOT shower/wash with your normal soap after using and rinsing off the CHG Soap. ? ?Pat yourself dry with a CLEAN TOWEL. ? ?Wear CLEAN PAJAMAS to bed the night before surgery ? ?Place CLEAN SHEETS on your bed the night before your surgery ? ?DO NOT SLEEP WITH PETS. ? ? ?Day of Surgery: ? ?Take a shower with CHG soap. ?Wear Clean/Comfortable clothing the morning of surgery ?Do not apply any deodorants/lotions.   ?Remember to brush your teeth WITH YOUR REGULAR TOOTHPASTE. ? ? ? ?Notify your provider: ?if you are in close contact with someone who has COVID  ?or if you develop a fever of 100.4 or greater,  sneezing, cough, sore throat, shortness of breath or body aches. ? ?  ?Please read over the following fact sheets that you were given.  ? ?

## 2022-01-17 ENCOUNTER — Inpatient Hospital Stay (HOSPITAL_COMMUNITY)
Admission: RE | Admit: 2022-01-17 | Discharge: 2022-01-17 | Disposition: A | Discharge status: Home / Self Care | Payer: Self-pay | Source: Ambulatory Visit

## 2022-01-18 ENCOUNTER — Encounter (HOSPITAL_COMMUNITY): Payer: Self-pay

## 2022-01-18 ENCOUNTER — Other Ambulatory Visit: Payer: Self-pay

## 2022-01-18 ENCOUNTER — Encounter (HOSPITAL_COMMUNITY)
Admission: RE | Admit: 2022-01-18 | Discharge: 2022-01-18 | Disposition: A | Discharge status: Home / Self Care | Payer: No Typology Code available for payment source | Source: Ambulatory Visit | Attending: Orthopedic Surgery | Admitting: Orthopedic Surgery

## 2022-01-18 VITALS — Wt 266.8 lb

## 2022-01-18 DIAGNOSIS — J45909 Unspecified asthma, uncomplicated: Secondary | ICD-10-CM | POA: Insufficient documentation

## 2022-01-18 DIAGNOSIS — E119 Type 2 diabetes mellitus without complications: Secondary | ICD-10-CM | POA: Diagnosis not present

## 2022-01-18 DIAGNOSIS — Z01818 Encounter for other preprocedural examination: Secondary | ICD-10-CM | POA: Diagnosis not present

## 2022-01-18 DIAGNOSIS — M4180 Other forms of scoliosis, site unspecified: Secondary | ICD-10-CM | POA: Diagnosis not present

## 2022-01-18 DIAGNOSIS — D649 Anemia, unspecified: Secondary | ICD-10-CM | POA: Diagnosis not present

## 2022-01-18 DIAGNOSIS — M48061 Spinal stenosis, lumbar region without neurogenic claudication: Secondary | ICD-10-CM | POA: Diagnosis not present

## 2022-01-18 DIAGNOSIS — M4316 Spondylolisthesis, lumbar region: Secondary | ICD-10-CM | POA: Diagnosis not present

## 2022-01-18 DIAGNOSIS — I10 Essential (primary) hypertension: Secondary | ICD-10-CM | POA: Diagnosis not present

## 2022-01-18 HISTORY — DX: Other specified postprocedural states: Z98.890

## 2022-01-18 HISTORY — DX: Anemia, unspecified: D64.9

## 2022-01-18 HISTORY — DX: Type 2 diabetes mellitus without complications: E11.9

## 2022-01-18 HISTORY — DX: Polyneuropathy, unspecified: G62.9

## 2022-01-18 HISTORY — DX: Unspecified asthma, uncomplicated: J45.909

## 2022-01-18 HISTORY — DX: Anxiety disorder, unspecified: F41.9

## 2022-01-18 HISTORY — DX: Unspecified osteoarthritis, unspecified site: M19.90

## 2022-01-18 HISTORY — DX: Nausea with vomiting, unspecified: R11.2

## 2022-01-18 HISTORY — DX: Other complications of anesthesia, initial encounter: T88.59XA

## 2022-01-18 LAB — BASIC METABOLIC PANEL
Anion gap: 7 (ref 5–15)
BUN: 15 mg/dL (ref 8–23)
CO2: 26 mmol/L (ref 22–32)
Calcium: 9.8 mg/dL (ref 8.9–10.3)
Chloride: 107 mmol/L (ref 98–111)
Creatinine, Ser: 1.23 mg/dL — ABNORMAL HIGH (ref 0.44–1.00)
GFR, Estimated: 48 mL/min — ABNORMAL LOW (ref >60)
Glucose, Bld: 99 mg/dL (ref 70–99)
Potassium: 4.4 mmol/L (ref 3.5–5.1)
Sodium: 140 mmol/L (ref 135–145)

## 2022-01-18 LAB — GLUCOSE, CAPILLARY: Glucose-Capillary: 112 mg/dL — ABNORMAL HIGH (ref 70–99)

## 2022-01-18 LAB — CBC
HCT: 36.6 % (ref 36.0–46.0)
Hemoglobin: 11.5 g/dL — ABNORMAL LOW (ref 12.0–15.0)
MCH: 29.9 pg (ref 26.0–34.0)
MCHC: 31.4 g/dL (ref 30.0–36.0)
MCV: 95.3 fL (ref 80.0–100.0)
Platelets: 206 10*3/uL (ref 150–400)
RBC: 3.84 MIL/uL — ABNORMAL LOW (ref 3.87–5.11)
RDW: 12.7 % (ref 11.5–15.5)
WBC: 7.1 10*3/uL (ref 4.0–10.5)
nRBC: 0 % (ref 0.0–0.2)

## 2022-01-18 LAB — SURGICAL PCR SCREEN
MRSA, PCR: NEGATIVE
Staphylococcus aureus: POSITIVE — AB

## 2022-01-18 LAB — HEMOGLOBIN A1C
Hgb A1c MFr Bld: 5.2 % (ref 4.8–5.6)
Mean Plasma Glucose: 102.54 mg/dL

## 2022-01-18 LAB — TYPE AND SCREEN
ABO/RH(D): A POS
Antibody Screen: NEGATIVE

## 2022-01-18 NOTE — Progress Notes (Signed)
PCP - Dr. Shirline Frees ?Cardiologist - Denies ? ?PPM/ICD - Denies ? ?Chest x-ray - N/A ?EKG - 01/18/22 ?Stress Test - Denies ?ECHO - Denies ?Cardiac Cath - Denies ? ?Sleep Study - Denies ? ?Fasting Blood Sugar - 130-140's ?Checks Blood Sugar 4x per week ? ?Blood Thinner Instructions: N/A ?Aspirin Instructions: N/A ? ?ERAS Protcol - Yes, G2 ? ?COVID TEST- N/A ? ? ?Anesthesia review: Yes, abnormal EKG ? ?Patient denies shortness of breath, fever, cough and chest pain at PAT appointment ? ? ?All instructions explained to the patient, with a verbal understanding of the material. Patient agrees to go over the instructions while at home for a better understanding. Patient also instructed to self quarantine after being tested for COVID-19. The opportunity to ask questions was provided. ? ? ?

## 2022-01-19 DIAGNOSIS — M7662 Achilles tendinitis, left leg: Secondary | ICD-10-CM | POA: Diagnosis not present

## 2022-01-19 NOTE — Progress Notes (Signed)
Anesthesia Chart Review: ? ? Case: [id number] Date/Time: 01/24/22 0715  ? Procedure: LEFT-SIDED LUMBAR 3- LUMBAR 4, LUMBAR 4- LUMBAR 5 TRANSFORAMINAL LUMBAR INTERBODY FUSION WITH INSTRUMENTATION AND ALLOGRAFT (Left)  ? Anesthesia type: General  ? 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  ? Location: [facility] ROOM 05 / [facility] OR  ? Surgeons: Phylliss Bob, MD  ? ?  ? ? ?DISCUSSION: ?Pt is 71 years old with hx HTN, DM, asthma, anemia ? ?VS: BP (P) 127/77   Pulse (P) 92   Temp (P) 36.8 ?C (Oral)   Resp (P) 17   Ht (P) _0  (1.727 m)   Wt 121 kg   SpO2 (P) 99%   BMI (P) 40.57 kg/m?  ? ?PROVIDERS: ?- PCP is Shirline Frees, MD ? ? ?LABS: Labs reviewed: Acceptable for surgery. ?(all labs ordered are listed, but only abnormal results are displayed) ? ?Labs Reviewed  ?SURGICAL PCR SCREEN - Abnormal; Notable for the following components:  ?    Result Value  ? Staphylococcus aureus POSITIVE (*)   ? All other components within normal limits  ?GLUCOSE, CAPILLARY - Abnormal; Notable for the following components:  ? Glucose-Capillary 112 (*)   ? All other components within normal limits  ?CBC - Abnormal; Notable for the following components:  ? RBC 3.84 (*)   ? Hemoglobin 11.5 (*)   ? All other components within normal limits  ?BASIC METABOLIC PANEL - Abnormal; Notable for the following components:  ? Creatinine, Ser 1.23 (*)   ? GFR, Estimated 48 (*)   ? All other components within normal limits  ?HEMOGLOBIN A1C  ?TYPE AND SCREEN  ? ? ?EKG 01/18/22: NSR. RBBB. LAFB. Bifascicular block  ?- No prior for comparison. Routed to PCP for f/u.  ? ? ?CV: none ? ?Past Medical History:  ?Diagnosis Date  ? Allergy   ? Anemia   ? Anxiety   ? Arthritis   ? Asthma   ? Cancer [facility])   ? Complication of anesthesia   ? Diabetes mellitus without complication ([place])   ? Hypertension   ? Neuropathy   ? PONV (postoperative nausea and vomiting)    ? ? ?Past Surgical History:  ?Procedure Laterality Date  ? ABDOMINAL HYSTERECTOMY    ? APPENDECTOMY    ? EYE SURGERY    ? LIPOMA EXCISION    ? MENISCUS REPAIR Left   ? ? ?MEDICATIONS: ? albuterol (VENTOLIN HFA) 108 (90 Base) MCG/ACT inhaler  ? B Complex-C (SUPER B COMPLEX PO)  ? Blood Glucose Monitoring Suppl (ONE TOUCH ULTRA 2) w/Device KIT  ? budesonide-formoterol (SYMBICORT) 160-4.5 MCG/ACT inhaler  ? citalopram (CELEXA) 20 MG tablet  ? fexofenadine (ALLEGRA) 180 MG tablet  ? furosemide (LASIX) 40 MG tablet  ? gabapentin (NEURONTIN) 600 MG tablet  ? lisinopril (ZESTRIL) 20 MG tablet  ? metFORMIN (GLUCOPHAGE) 500 MG tablet  ? methocarbamol (ROBAXIN) 500 MG tablet  ? Multiple Vitamins-Minerals (MULTIVITAMIN WITH MINERALS) tablet  ? Omega-3 Fatty Acids (FISH OIL) 1000 MG CAPS  ? ONETOUCH ULTRA test strip  ? potassium chloride SA (KLOR-CON M) 20 MEQ tablet  ? Probiotic Product (PROBIOTIC DAILY) CAPS  ? rosuvastatin (CRESTOR) 5 MG tablet  ? ?No current facility-administered medications for this encounter.  ? ? ?If no changes, I anticipate pt can proceed with surgery as scheduled.  ? ?Willeen Cass, PhD, FNP-BC ?[facility]/Anesthesiology ?Phone: [phone] ?01/19/2022 12:46  PM ? ? ? ? ? ? ? ?

## 2022-01-19 NOTE — Anesthesia Preprocedure Evaluation (Addendum)
Anesthesia Evaluation  ?Patient identified by MRN, date of birth, ID band ?Patient awake ? ? ? ?Reviewed: ?Allergy & Precautions, NPO status , Patient's Chart, lab work & pertinent test results ? ?History of Anesthesia Complications ?(+) PONV and history of anesthetic complications (did well w/ hysterectomy about 10 years ago- unsure what drugs she got) ? ?Airway ?Mallampati: II ? ?TM Distance: >3 FB ?Neck ROM: Full ? ? ? Dental ? ?(+) Teeth Intact, Dental Advisory Given ?  ?Pulmonary ?asthma (well controlled) ,  ?  ?Pulmonary exam normal ?breath sounds clear to auscultation ? ? ? ? ? ? Cardiovascular ?hypertension, Pt. on medications ?Normal cardiovascular exam ?Rhythm:Regular Rate:Normal ? ? ?  ?Neuro/Psych ?PSYCHIATRIC DISORDERS Anxiety negative neurological ROS ?   ? GI/Hepatic ?negative GI ROS, Neg liver ROS,   ?Endo/Other  ?diabetes, Well Controlled, Type 2, Oral Hypoglycemic AgentsMorbid obesitya1c 5.2 ?BMI 40 ?FS 82 in preop ? Renal/GU ?Renal InsufficiencyRenal diseaseCr 1.23  ?negative genitourinary ?  ?Musculoskeletal ? ?(+) Arthritis , Osteoarthritis,  spinal stenosis and instability at L3/4 and L4/5- pain B/L LE L>R  ? Abdominal ?(+) + obese,   ?Peds ?negative pediatric ROS ?(+)  Hematology ?negative hematology ROS ?(+) hct 36.6   ?Anesthesia Other Findings ?Tramadol '100mg'$  qhs ? Reproductive/Obstetrics ?negative OB ROS ? ?  ? ? ? ? ? ? ? ? ? ? ? ? ? ?  ?  ? ? ? ? ? ?Anesthesia Physical ?Anesthesia Plan ? ?ASA: 3 ? ?Anesthesia Plan: General  ? ?Post-op Pain Management: Tylenol PO (pre-op)*  ? ?Induction: Intravenous ? ?PONV Risk Score and Plan: 4 or greater and Ondansetron, Dexamethasone, Midazolam, Scopolamine patch - Pre-op and Treatment may vary due to age or medical condition ? ?Airway Management Planned: Oral ETT ? ?Additional Equipment: None ? ?Intra-op Plan:  ? ?Post-operative Plan: Extubation in OR ? ?Informed Consent: I have reviewed the patients History and  Physical, chart, labs and discussed the procedure including the risks, benefits and alternatives for the proposed anesthesia with the patient or authorized representative who has indicated his/her understanding and acceptance.  ? ? ? ?Dental advisory given ? ?Plan Discussed with: CRNA ? ?Anesthesia Plan Comments: (FS 82 in preop- will check glucose intraop q1h and treat as needed)  ? ? ? ? ?Anesthesia Quick Evaluation ? ?

## 2022-01-24 ENCOUNTER — Encounter (HOSPITAL_COMMUNITY): Payer: Self-pay | Admitting: Orthopedic Surgery

## 2022-01-24 ENCOUNTER — Ambulatory Visit (HOSPITAL_COMMUNITY): Payer: No Typology Code available for payment source

## 2022-01-24 ENCOUNTER — Inpatient Hospital Stay (HOSPITAL_COMMUNITY)
Admission: RE | Admit: 2022-01-24 | Discharge: 2022-01-26 | DRG: 455 | Disposition: A | Discharge status: Home / Self Care | Payer: No Typology Code available for payment source | Attending: Orthopedic Surgery | Admitting: Orthopedic Surgery

## 2022-01-24 ENCOUNTER — Other Ambulatory Visit: Payer: Self-pay

## 2022-01-24 ENCOUNTER — Ambulatory Visit (HOSPITAL_COMMUNITY): Payer: No Typology Code available for payment source | Admitting: Emergency Medicine

## 2022-01-24 ENCOUNTER — Ambulatory Visit (HOSPITAL_BASED_OUTPATIENT_CLINIC_OR_DEPARTMENT_OTHER): Payer: No Typology Code available for payment source | Admitting: Anesthesiology

## 2022-01-24 ENCOUNTER — Ambulatory Visit (HOSPITAL_COMMUNITY)
Admission: RE | Disposition: A | Discharge status: Home / Self Care | Payer: Self-pay | Source: Home / Self Care | Attending: Orthopedic Surgery

## 2022-01-24 DIAGNOSIS — Z79899 Other long term (current) drug therapy: Secondary | ICD-10-CM

## 2022-01-24 DIAGNOSIS — Z7984 Long term (current) use of oral hypoglycemic drugs: Secondary | ICD-10-CM | POA: Diagnosis not present

## 2022-01-24 DIAGNOSIS — M5416 Radiculopathy, lumbar region: Secondary | ICD-10-CM | POA: Diagnosis present

## 2022-01-24 DIAGNOSIS — I10 Essential (primary) hypertension: Secondary | ICD-10-CM | POA: Diagnosis not present

## 2022-01-24 DIAGNOSIS — M4316 Spondylolisthesis, lumbar region: Secondary | ICD-10-CM

## 2022-01-24 DIAGNOSIS — M2428 Disorder of ligament, vertebrae: Secondary | ICD-10-CM | POA: Diagnosis present

## 2022-01-24 DIAGNOSIS — M5410 Radiculopathy, site unspecified: Principal | ICD-10-CM | POA: Diagnosis present

## 2022-01-24 DIAGNOSIS — E119 Type 2 diabetes mellitus without complications: Secondary | ICD-10-CM

## 2022-01-24 DIAGNOSIS — M48061 Spinal stenosis, lumbar region without neurogenic claudication: Secondary | ICD-10-CM

## 2022-01-24 DIAGNOSIS — Z7951 Long term (current) use of inhaled steroids: Secondary | ICD-10-CM

## 2022-01-24 DIAGNOSIS — J45909 Unspecified asthma, uncomplicated: Secondary | ICD-10-CM | POA: Diagnosis present

## 2022-01-24 DIAGNOSIS — M1990 Unspecified osteoarthritis, unspecified site: Secondary | ICD-10-CM | POA: Diagnosis present

## 2022-01-24 DIAGNOSIS — Z90710 Acquired absence of both cervix and uterus: Secondary | ICD-10-CM

## 2022-01-24 DIAGNOSIS — E1140 Type 2 diabetes mellitus with diabetic neuropathy, unspecified: Secondary | ICD-10-CM | POA: Diagnosis present

## 2022-01-24 HISTORY — PX: TRANSFORAMINAL LUMBAR INTERBODY FUSION (TLIF) WITH PEDICLE SCREW FIXATION 2 LEVEL: SHX6142

## 2022-01-24 LAB — ABO/RH: ABO/RH(D): A POS

## 2022-01-24 LAB — GLUCOSE, CAPILLARY
Glucose-Capillary: 82 mg/dL (ref 70–99)
Glucose-Capillary: 115 mg/dL — ABNORMAL HIGH (ref 70–99)
Glucose-Capillary: 137 mg/dL — ABNORMAL HIGH (ref 70–99)
Glucose-Capillary: 215 mg/dL — ABNORMAL HIGH (ref 70–99)
Glucose-Capillary: 159 mg/dL — ABNORMAL HIGH (ref 70–99)

## 2022-01-24 SURGERY — TRANSFORAMINAL LUMBAR INTERBODY FUSION (TLIF) WITH PEDICLE SCREW FIXATION 2 LEVEL
Anesthesia: General | Site: Spine Lumbar | Laterality: Left

## 2022-01-24 MED ORDER — SODIUM CHLORIDE 0.9 % IV SOLN: 250.0000 mL | INTRAVENOUS | Status: DC

## 2022-01-24 MED ORDER — METFORMIN HCL 500 MG PO TABS
500.0000 mg | ORAL_TABLET | Freq: Two times a day (BID) | ORAL | Status: DC
  Administered 2022-01-24 – 2022-01-26 (×4): 500 mg via ORAL
  Filled 2022-01-24 (×4): qty 1

## 2022-01-24 MED ORDER — THROMBIN 20000 UNITS EX SOLR
CUTANEOUS | Status: DC | PRN
  Administered 2022-01-24: 20000 [IU] via TOPICAL

## 2022-01-24 MED ORDER — OXYCODONE HCL 5 MG/5ML PO SOLN: 5.0000 mg | Freq: Once | ORAL | Status: DC | PRN

## 2022-01-24 MED ORDER — PROPOFOL 10 MG/ML IV BOLUS
INTRAVENOUS | Status: DC | PRN
  Administered 2022-01-24: 130 mg via INTRAVENOUS

## 2022-01-24 MED ORDER — EPHEDRINE 5 MG/ML INJ
INTRAVENOUS | Status: AC
  Filled 2022-01-24: qty 5

## 2022-01-24 MED ORDER — HYDROMORPHONE HCL 1 MG/ML IJ SOLN
INTRAMUSCULAR | Status: AC
Start: 2022-01-24 — End: 2022-01-25
  Filled 2022-01-24: qty 1

## 2022-01-24 MED ORDER — LACTATED RINGERS IV SOLN: INTRAVENOUS | Status: DC

## 2022-01-24 MED ORDER — DEXAMETHASONE SODIUM PHOSPHATE 10 MG/ML IJ SOLN
INTRAMUSCULAR | Status: DC | PRN
  Administered 2022-01-24: 10 mg via INTRAVENOUS

## 2022-01-24 MED ORDER — FLEET ENEMA 7-19 GM/118ML RE ENEM: 1.0000 | ENEMA | Freq: Once | RECTAL | Status: DC | PRN

## 2022-01-24 MED ORDER — ALBUTEROL SULFATE HFA 108 (90 BASE) MCG/ACT IN AERS: 2.0000 | INHALATION_SPRAY | Freq: Four times a day (QID) | RESPIRATORY_TRACT | Status: DC | PRN

## 2022-01-24 MED ORDER — BUPIVACAINE LIPOSOME 1.3 % IJ SUSP
INTRAMUSCULAR | Status: AC
  Filled 2022-01-24: qty 20

## 2022-01-24 MED ORDER — OXYCODONE-ACETAMINOPHEN 5-325 MG PO TABS
1.0000 | ORAL_TABLET | ORAL | Status: DC | PRN
  Administered 2022-01-24 – 2022-01-25 (×8): 2 via ORAL
  Administered 2022-01-26: 1 via ORAL
  Filled 2022-01-24 (×2): qty 2
  Filled 2022-01-24: qty 1
  Filled 2022-01-24 (×6): qty 2

## 2022-01-24 MED ORDER — ROSUVASTATIN CALCIUM 5 MG PO TABS
5.0000 mg | ORAL_TABLET | Freq: Every day | ORAL | Status: DC
  Administered 2022-01-25: 5 mg via ORAL
  Filled 2022-01-24: qty 1

## 2022-01-24 MED ORDER — ONDANSETRON HCL 4 MG PO TABS: 4.0000 mg | ORAL_TABLET | Freq: Four times a day (QID) | ORAL | Status: DC | PRN

## 2022-01-24 MED ORDER — AMISULPRIDE (ANTIEMETIC) 5 MG/2ML IV SOLN
10.0000 mg | Freq: Once | INTRAVENOUS | Status: AC | PRN
  Administered 2022-01-24: 10 mg via INTRAVENOUS

## 2022-01-24 MED ORDER — DEXAMETHASONE SODIUM PHOSPHATE 10 MG/ML IJ SOLN
INTRAMUSCULAR | Status: AC
  Filled 2022-01-24: qty 1

## 2022-01-24 MED ORDER — HYDROCODONE-ACETAMINOPHEN 5-325 MG PO TABS: 1.0000 | ORAL_TABLET | ORAL | Status: DC | PRN

## 2022-01-24 MED ORDER — CHLORHEXIDINE GLUCONATE 0.12 % MT SOLN
15.0000 mL | Freq: Once | OROMUCOSAL | Status: AC
  Administered 2022-01-24: 15 mL via OROMUCOSAL
  Filled 2022-01-24: qty 15

## 2022-01-24 MED ORDER — SODIUM CHLORIDE 0.9% FLUSH
3.0000 mL | Freq: Two times a day (BID) | INTRAVENOUS | Status: DC
  Administered 2022-01-24 (×2): 3 mL via INTRAVENOUS

## 2022-01-24 MED ORDER — MIDAZOLAM HCL 2 MG/2ML IJ SOLN
INTRAMUSCULAR | Status: AC
  Filled 2022-01-24: qty 2

## 2022-01-24 MED ORDER — LISINOPRIL 20 MG PO TABS
20.0000 mg | ORAL_TABLET | Freq: Every day | ORAL | Status: DC
  Filled 2022-01-24: qty 1

## 2022-01-24 MED ORDER — PHENYLEPHRINE HCL-NACL 20-0.9 MG/250ML-% IV SOLN
INTRAVENOUS | Status: DC | PRN
Start: 2022-01-24 — End: 2022-01-24
  Administered 2022-01-24: 20 ug/min via INTRAVENOUS

## 2022-01-24 MED ORDER — ONDANSETRON HCL 4 MG/2ML IJ SOLN: 4.0000 mg | Freq: Once | INTRAMUSCULAR | Status: DC | PRN

## 2022-01-24 MED ORDER — BUPIVACAINE-EPINEPHRINE (PF) 0.25% -1:200000 IJ SOLN
INTRAMUSCULAR | Status: AC
  Filled 2022-01-24: qty 30

## 2022-01-24 MED ORDER — PHENYLEPHRINE 40 MCG/ML (10ML) SYRINGE FOR IV PUSH (FOR BLOOD PRESSURE SUPPORT)
PREFILLED_SYRINGE | INTRAVENOUS | Status: DC | PRN
  Administered 2022-01-24: 80 ug via INTRAVENOUS
  Administered 2022-01-24: 120 ug via INTRAVENOUS
  Administered 2022-01-24 (×3): 80 ug via INTRAVENOUS

## 2022-01-24 MED ORDER — LORATADINE 10 MG PO TABS
10.0000 mg | ORAL_TABLET | Freq: Every day | ORAL | Status: DC
Start: 2022-01-25 — End: 2022-01-26
  Administered 2022-01-25: 10 mg via ORAL
  Filled 2022-01-24: qty 1

## 2022-01-24 MED ORDER — MENTHOL 3 MG MT LOZG: 1.0000 | LOZENGE | OROMUCOSAL | Status: DC | PRN

## 2022-01-24 MED ORDER — SUGAMMADEX SODIUM 200 MG/2ML IV SOLN
INTRAVENOUS | Status: DC | PRN
  Administered 2022-01-24: 400 mg via INTRAVENOUS

## 2022-01-24 MED ORDER — HYDROMORPHONE HCL 1 MG/ML IJ SOLN
INTRAMUSCULAR | Status: AC
  Filled 2022-01-24: qty 1

## 2022-01-24 MED ORDER — BUPIVACAINE LIPOSOME 1.3 % IJ SUSP
INTRAMUSCULAR | Status: DC | PRN
  Administered 2022-01-24: 20 mL

## 2022-01-24 MED ORDER — SENNOSIDES-DOCUSATE SODIUM 8.6-50 MG PO TABS: 1.0000 | ORAL_TABLET | Freq: Every evening | ORAL | Status: DC | PRN

## 2022-01-24 MED ORDER — ZOLPIDEM TARTRATE 5 MG PO TABS: 5.0000 mg | ORAL_TABLET | Freq: Every evening | ORAL | Status: DC | PRN

## 2022-01-24 MED ORDER — ONDANSETRON HCL 4 MG/2ML IJ SOLN
INTRAMUSCULAR | Status: AC
  Filled 2022-01-24: qty 2

## 2022-01-24 MED ORDER — DOCUSATE SODIUM 100 MG PO CAPS
100.0000 mg | ORAL_CAPSULE | Freq: Two times a day (BID) | ORAL | Status: DC
  Administered 2022-01-24 – 2022-01-25 (×3): 100 mg via ORAL
  Filled 2022-01-24 (×3): qty 1

## 2022-01-24 MED ORDER — POVIDONE-IODINE 7.5 % EX SOLN: Freq: Once | CUTANEOUS | Status: DC

## 2022-01-24 MED ORDER — ONDANSETRON HCL 4 MG/2ML IJ SOLN
INTRAMUSCULAR | Status: DC | PRN
  Administered 2022-01-24: 4 mg via INTRAVENOUS

## 2022-01-24 MED ORDER — BUPIVACAINE-EPINEPHRINE 0.25% -1:200000 IJ SOLN
INTRAMUSCULAR | Status: DC | PRN
  Administered 2022-01-24: 7 mL
  Administered 2022-01-24: 20 mL

## 2022-01-24 MED ORDER — EPHEDRINE SULFATE-NACL 50-0.9 MG/10ML-% IV SOSY
PREFILLED_SYRINGE | INTRAVENOUS | Status: DC | PRN
  Administered 2022-01-24: 10 mg via INTRAVENOUS

## 2022-01-24 MED ORDER — BISACODYL 5 MG PO TBEC: 5.0000 mg | DELAYED_RELEASE_TABLET | Freq: Every day | ORAL | Status: DC | PRN

## 2022-01-24 MED ORDER — HYDROMORPHONE HCL 1 MG/ML IJ SOLN
0.2500 mg | INTRAMUSCULAR | Status: DC | PRN
  Administered 2022-01-24 (×3): 0.5 mg via INTRAVENOUS

## 2022-01-24 MED ORDER — AMISULPRIDE (ANTIEMETIC) 5 MG/2ML IV SOLN
INTRAVENOUS | Status: AC
  Filled 2022-01-24: qty 4

## 2022-01-24 MED ORDER — POTASSIUM CHLORIDE IN NACL 20-0.9 MEQ/L-% IV SOLN
INTRAVENOUS | Status: DC
  Filled 2022-01-24: qty 1000

## 2022-01-24 MED ORDER — OXYCODONE HCL 5 MG PO TABS: 5.0000 mg | ORAL_TABLET | Freq: Once | ORAL | Status: DC | PRN

## 2022-01-24 MED ORDER — ACETAMINOPHEN 325 MG PO TABS: 650.0000 mg | ORAL_TABLET | ORAL | Status: DC | PRN

## 2022-01-24 MED ORDER — 0.9 % SODIUM CHLORIDE (POUR BTL) OPTIME
TOPICAL | Status: DC | PRN
Start: 2022-01-24 — End: 2022-01-24
  Administered 2022-01-24 (×2): 1000 mL

## 2022-01-24 MED ORDER — FENTANYL CITRATE (PF) 250 MCG/5ML IJ SOLN
INTRAMUSCULAR | Status: AC
  Filled 2022-01-24: qty 5

## 2022-01-24 MED ORDER — MORPHINE SULFATE (PF) 2 MG/ML IV SOLN: 1.0000 mg | INTRAVENOUS | Status: DC | PRN

## 2022-01-24 MED ORDER — HYDROXYZINE HCL 50 MG/ML IM SOLN
50.0000 mg | Freq: Four times a day (QID) | INTRAMUSCULAR | Status: DC | PRN
Start: 2022-01-24 — End: 2022-01-26

## 2022-01-24 MED ORDER — METHOCARBAMOL 500 MG PO TABS
500.0000 mg | ORAL_TABLET | Freq: Four times a day (QID) | ORAL | Status: DC
  Administered 2022-01-24 – 2022-01-26 (×6): 500 mg via ORAL
  Filled 2022-01-24 (×5): qty 1

## 2022-01-24 MED ORDER — ACETAMINOPHEN 500 MG PO TABS
1000.0000 mg | ORAL_TABLET | Freq: Once | ORAL | Status: AC
  Administered 2022-01-24: 1000 mg via ORAL
  Filled 2022-01-24: qty 2

## 2022-01-24 MED ORDER — ADULT MULTIVITAMIN W/MINERALS CH
1.0000 | ORAL_TABLET | Freq: Every day | ORAL | Status: DC
  Administered 2022-01-25: 1 via ORAL
  Filled 2022-01-24: qty 1

## 2022-01-24 MED ORDER — CEFAZOLIN IN SODIUM CHLORIDE 3-0.9 GM/100ML-% IV SOLN
3.0000 g | INTRAVENOUS | Status: AC
  Administered 2022-01-24: 3 g via INTRAVENOUS
  Filled 2022-01-24: qty 100

## 2022-01-24 MED ORDER — ACETAMINOPHEN 650 MG RE SUPP: 650.0000 mg | RECTAL | Status: DC | PRN

## 2022-01-24 MED ORDER — CITALOPRAM HYDROBROMIDE 20 MG PO TABS
20.0000 mg | ORAL_TABLET | ORAL | Status: DC
  Administered 2022-01-25: 20 mg via ORAL
  Filled 2022-01-24: qty 1

## 2022-01-24 MED ORDER — PROBIOTIC DAILY PO CAPS: 1.0000 | ORAL_CAPSULE | Freq: Every day | ORAL | Status: DC

## 2022-01-24 MED ORDER — POTASSIUM CHLORIDE CRYS ER 20 MEQ PO TBCR
20.0000 meq | EXTENDED_RELEASE_TABLET | Freq: Every day | ORAL | Status: DC
  Administered 2022-01-25: 20 meq via ORAL
  Filled 2022-01-24: qty 1

## 2022-01-24 MED ORDER — METHOCARBAMOL 500 MG PO TABS
ORAL_TABLET | ORAL | Status: AC
  Filled 2022-01-24: qty 1

## 2022-01-24 MED ORDER — ALUM & MAG HYDROXIDE-SIMETH 200-200-20 MG/5ML PO SUSP: 30.0000 mL | Freq: Four times a day (QID) | ORAL | Status: DC | PRN

## 2022-01-24 MED ORDER — CEFAZOLIN SODIUM-DEXTROSE 2-4 GM/100ML-% IV SOLN
2.0000 g | Freq: Three times a day (TID) | INTRAVENOUS | Status: AC
  Administered 2022-01-24 (×2): 2 g via INTRAVENOUS
  Filled 2022-01-24 (×2): qty 100

## 2022-01-24 MED ORDER — ORAL CARE MOUTH RINSE: 15.0000 mL | Freq: Once | OROMUCOSAL | Status: AC

## 2022-01-24 MED ORDER — INSULIN ASPART 100 UNIT/ML IJ SOLN: 0.0000 [IU] | INTRAMUSCULAR | Status: DC | PRN

## 2022-01-24 MED ORDER — FUROSEMIDE 40 MG PO TABS
40.0000 mg | ORAL_TABLET | Freq: Every day | ORAL | Status: DC
  Administered 2022-01-25: 40 mg via ORAL
  Filled 2022-01-24: qty 1

## 2022-01-24 MED ORDER — SODIUM CHLORIDE 0.9% FLUSH: 3.0000 mL | INTRAVENOUS | Status: DC | PRN

## 2022-01-24 MED ORDER — ROCURONIUM BROMIDE 10 MG/ML (PF) SYRINGE
PREFILLED_SYRINGE | INTRAVENOUS | Status: AC
  Filled 2022-01-24: qty 30

## 2022-01-24 MED ORDER — PHENYLEPHRINE 40 MCG/ML (10ML) SYRINGE FOR IV PUSH (FOR BLOOD PRESSURE SUPPORT)
PREFILLED_SYRINGE | INTRAVENOUS | Status: AC
  Filled 2022-01-24: qty 20

## 2022-01-24 MED ORDER — FENTANYL CITRATE (PF) 250 MCG/5ML IJ SOLN
INTRAMUSCULAR | Status: DC | PRN
  Administered 2022-01-24 (×3): 50 ug via INTRAVENOUS

## 2022-01-24 MED ORDER — MIDAZOLAM HCL 2 MG/2ML IJ SOLN
INTRAMUSCULAR | Status: DC | PRN
  Administered 2022-01-24: 1 mg via INTRAVENOUS

## 2022-01-24 MED ORDER — SCOPOLAMINE 1 MG/3DAYS TD PT72
1.0000 | MEDICATED_PATCH | TRANSDERMAL | Status: DC
  Administered 2022-01-24: 1.5 mg via TRANSDERMAL
  Filled 2022-01-24: qty 1

## 2022-01-24 MED ORDER — MOMETASONE FURO-FORMOTEROL FUM 200-5 MCG/ACT IN AERO
2.0000 | INHALATION_SPRAY | Freq: Two times a day (BID) | RESPIRATORY_TRACT | Status: DC
Start: 2022-01-24 — End: 2022-01-26
  Administered 2022-01-25 (×2): 2 via RESPIRATORY_TRACT
  Filled 2022-01-24: qty 8.8

## 2022-01-24 MED ORDER — ROCURONIUM BROMIDE 10 MG/ML (PF) SYRINGE
PREFILLED_SYRINGE | INTRAVENOUS | Status: DC | PRN
  Administered 2022-01-24: 100 mg via INTRAVENOUS
  Administered 2022-01-24 (×3): 20 mg via INTRAVENOUS
  Administered 2022-01-24: 40 mg via INTRAVENOUS

## 2022-01-24 MED ORDER — ONDANSETRON HCL 4 MG/2ML IJ SOLN: 4.0000 mg | Freq: Four times a day (QID) | INTRAMUSCULAR | Status: DC | PRN

## 2022-01-24 MED ORDER — PHENOL 1.4 % MT LIQD: 1.0000 | OROMUCOSAL | Status: DC | PRN

## 2022-01-24 MED ORDER — LIDOCAINE 2% (20 MG/ML) 5 ML SYRINGE
INTRAMUSCULAR | Status: DC | PRN
  Administered 2022-01-24: 60 mg via INTRAVENOUS

## 2022-01-24 MED ORDER — THROMBIN (RECOMBINANT) 20000 UNITS EX SOLR
CUTANEOUS | Status: AC
  Filled 2022-01-24: qty 20000

## 2022-01-24 MED ORDER — LIDOCAINE 2% (20 MG/ML) 5 ML SYRINGE
INTRAMUSCULAR | Status: AC
  Filled 2022-01-24: qty 5

## 2022-01-24 MED ORDER — GABAPENTIN 600 MG PO TABS
600.0000 mg | ORAL_TABLET | Freq: Three times a day (TID) | ORAL | Status: DC
  Administered 2022-01-24 – 2022-01-25 (×5): 600 mg via ORAL
  Filled 2022-01-24 (×5): qty 1

## 2022-01-24 MED ORDER — PROPOFOL 10 MG/ML IV BOLUS
INTRAVENOUS | Status: AC
  Filled 2022-01-24: qty 20

## 2022-01-24 MED FILL — Sodium Chloride IV Soln 0.9%: INTRAVENOUS | Qty: 1000 | Status: AC

## 2022-01-24 MED FILL — Heparin Sodium (Porcine) Inj 1000 Unit/ML: INTRAMUSCULAR | Qty: 30 | Status: AC

## 2022-01-24 SURGICAL SUPPLY — 89 items
AGENT HMST KT MTR STRL THRMB (HEMOSTASIS)
APL SKNCLS STERI-STRIP NONHPOA (GAUZE/BANDAGES/DRESSINGS) ×1
BAG COUNTER SPONGE SURGICOUNT (BAG) ×3 IMPLANT
BAG SPNG CNTER NS LX DISP (BAG) ×1
BENZOIN TINCTURE PRP APPL 2/3 (GAUZE/BANDAGES/DRESSINGS) ×3 IMPLANT
BLADE CLIPPER SURG (BLADE) IMPLANT
BUR PRESCISION 1.7 ELITE (BURR) ×3 IMPLANT
BUR ROUND FLUTED 5 RND (BURR) ×2 IMPLANT
BUR ROUND PRECISION 4.0 (BURR) IMPLANT
BUR SABER RD CUTTING 3.0 (BURR) IMPLANT
CAGE SABLE 10X30 9-16 8D (Cage) ×2 IMPLANT
CANNULA GRAFT BNE VG PRE-FILL (Bone Implant) IMPLANT
CARTRIDGE OIL MAESTRO DRILL (MISCELLANEOUS) ×2 IMPLANT
CNTNR URN SCR LID CUP LEK RST (MISCELLANEOUS) ×2 IMPLANT
CONT SPEC 4OZ STRL OR WHT (MISCELLANEOUS) ×2
COVER MAYO STAND STRL (DRAPES) ×6 IMPLANT
COVER SURGICAL LIGHT HANDLE (MISCELLANEOUS) ×3 IMPLANT
DIFFUSER DRILL AIR PNEUMATIC (MISCELLANEOUS) ×3 IMPLANT
DISPENSER GRAFT BNE VG (MISCELLANEOUS) IMPLANT
DISPENSER VIVIGEN BONE GRAFT (MISCELLANEOUS) ×2 IMPLANT
DRAIN CHANNEL 15F RND FF W/TCR (WOUND CARE) IMPLANT
DRAPE C-ARM 42X72 X-RAY (DRAPES) ×2 IMPLANT
DRAPE C-ARMOR (DRAPES) IMPLANT
DRAPE POUCH INSTRU U-SHP 10X18 (DRAPES) ×3 IMPLANT
DRAPE SURG 17X23 STRL (DRAPES) ×12 IMPLANT
DURAPREP 26ML APPLICATOR (WOUND CARE) ×3 IMPLANT
ELECT BLADE 4.0 EZ CLEAN MEGAD (MISCELLANEOUS) ×2
ELECT CAUTERY BLADE 6.4 (BLADE) ×3 IMPLANT
ELECT REM PT RETURN 9FT ADLT (ELECTROSURGICAL) ×2
ELECTRODE BLDE 4.0 EZ CLN MEGD (MISCELLANEOUS) ×1 IMPLANT
ELECTRODE REM PT RTRN 9FT ADLT (ELECTROSURGICAL) ×2 IMPLANT
EVACUATOR SILICONE 100CC (DRAIN) IMPLANT
FILTER STRAW FLUID ASPIR (MISCELLANEOUS) ×2 IMPLANT
GAUZE 4X4 16PLY ~~LOC~~+RFID DBL (SPONGE) ×3 IMPLANT
GAUZE SPONGE 4X4 12PLY STRL (GAUZE/BANDAGES/DRESSINGS) ×2 IMPLANT
GLOVE SRG 8 PF TXTR STRL LF DI (GLOVE) ×2 IMPLANT
GLOVE SURG ENC MOIS LTX SZ6.5 (GLOVE) ×3 IMPLANT
GLOVE SURG ENC MOIS LTX SZ8 (GLOVE) ×2 IMPLANT
GLOVE SURG UNDER POLY LF SZ7 (GLOVE) ×3 IMPLANT
GLOVE SURG UNDER POLY LF SZ8 (GLOVE) ×2
GOWN STRL REUS W/ TWL LRG LVL3 (GOWN DISPOSABLE) ×4 IMPLANT
GOWN STRL REUS W/ TWL XL LVL3 (GOWN DISPOSABLE) ×1 IMPLANT
GOWN STRL REUS W/TWL LRG LVL3 (GOWN DISPOSABLE) ×4
GOWN STRL REUS W/TWL XL LVL3 (GOWN DISPOSABLE) ×2
GRAFT BONE CANNULA VIVIGEN 3 (Bone Implant) ×10 IMPLANT
IV CATH 14GX2 1/4 (CATHETERS) ×3 IMPLANT
KIT BASIN OR (CUSTOM PROCEDURE TRAY) ×2 IMPLANT
KIT POSITION SURG JACKSON T1 (MISCELLANEOUS) ×2 IMPLANT
KIT TURNOVER KIT B (KITS) ×2 IMPLANT
MARKER SKIN DUAL TIP RULER LAB (MISCELLANEOUS) ×6 IMPLANT
NDL 18GX1X1/2 (RX/OR ONLY) (NEEDLE) ×2 IMPLANT
NDL HYPO 25GX1X1/2 BEV (NEEDLE) ×1 IMPLANT
NDL SPNL 18GX3.5 QUINCKE PK (NEEDLE) ×4 IMPLANT
NEEDLE 18GX1X1/2 (RX/OR ONLY) (NEEDLE) ×2 IMPLANT
NEEDLE 22X1 1/2 (OR ONLY) (NEEDLE) ×6 IMPLANT
NEEDLE HYPO 25GX1X1/2 BEV (NEEDLE) ×2 IMPLANT
NEEDLE SPNL 18GX3.5 QUINCKE PK (NEEDLE) ×4 IMPLANT
NS IRRIG 1000ML POUR BTL (IV SOLUTION) ×6 IMPLANT
OIL CARTRIDGE MAESTRO DRILL (MISCELLANEOUS) ×2
PACK LAMINECTOMY ORTHO (CUSTOM PROCEDURE TRAY) ×3 IMPLANT
PACK UNIVERSAL I (CUSTOM PROCEDURE TRAY) ×3 IMPLANT
PAD ARMBOARD 7.5X6 YLW CONV (MISCELLANEOUS) ×6 IMPLANT
PATTIES SURGICAL .5 X1 (DISPOSABLE) ×3 IMPLANT
PATTIES SURGICAL .5X1.5 (GAUZE/BANDAGES/DRESSINGS) ×3 IMPLANT
ROD SPINAL EXPEDIUM 5.5X70 (Rod) ×2 IMPLANT
SCREW SET SINGLE INNER (Screw) ×6 IMPLANT
SCREW VIPER 7MMX30MM CORTICAL (Screw) ×1 IMPLANT
SCREW VIPER CORT FIX 6.00X30 (Screw) ×5 IMPLANT
SLEEVE SCD COMPRESS KNEE MED (STOCKING) ×1 IMPLANT
SPONGE INTESTINAL PEANUT (DISPOSABLE) ×2 IMPLANT
SPONGE SURGIFOAM ABS GEL 100 (HEMOSTASIS) ×3 IMPLANT
STRIP CLOSURE SKIN 1/2X4 (GAUZE/BANDAGES/DRESSINGS) ×5 IMPLANT
SURGIFLO W/THROMBIN 8M KIT (HEMOSTASIS) IMPLANT
SUT MNCRL AB 4-0 PS2 18 (SUTURE) ×2 IMPLANT
SUT VIC AB 0 CT1 18XCR BRD 8 (SUTURE) ×1 IMPLANT
SUT VIC AB 0 CT1 8-18 (SUTURE) ×4
SUT VIC AB 1 CT1 18XCR BRD 8 (SUTURE) ×1 IMPLANT
SUT VIC AB 1 CT1 8-18 (SUTURE) ×2
SUT VIC AB 2-0 CT2 18 VCP726D (SUTURE) ×4 IMPLANT
SYR 20ML LL LF (SYRINGE) ×4 IMPLANT
SYR BULB IRRIG 60ML STRL (SYRINGE) ×3 IMPLANT
SYR CONTROL 10ML LL (SYRINGE) ×6 IMPLANT
SYR TB 1ML LUER SLIP (SYRINGE) ×3 IMPLANT
TAP EXPEDIUM DL 4.35 (INSTRUMENTS) ×1 IMPLANT
TAP EXPEDIUM DL 5.0 (INSTRUMENTS) ×1 IMPLANT
TAP EXPEDIUM DL 6.0 (INSTRUMENTS) ×1 IMPLANT
TRAY FOLEY MTR SLVR 16FR STAT (SET/KITS/TRAYS/PACK) ×3 IMPLANT
WATER STERILE IRR 1000ML POUR (IV SOLUTION) ×3 IMPLANT
YANKAUER SUCT BULB TIP NO VENT (SUCTIONS) ×2 IMPLANT

## 2022-01-24 NOTE — Op Note (Signed)
PATIENT NAME: Wendy Soto  ? ?MEDICAL RECORD NO.:   [id number]  ?  ?DATE OF BIRTH: Oct 24, 1951  ?  ?DATE OF PROCEDURE: 01/24/2022  ? ?  ?                            OPERATIVE REPORT ?  ?  ?PREOPERATIVE DIAGNOSES: ?1. Severe spinal stenosis L3-4, L4-5 (R41.638) ?2. Bilateral lumbar radiculopathy (M54.16) ?3. 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  ?POSTOPERATIVE DIAGNOSES: ?1. Severe spinal stenosis L3-4, L4-5 (G53.646) ?2. Bilateral lumbar radiculopathy (M54.16) ?3. 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  ?PROCEDURES: ?1. Lumbar decompression, L3-4, L4-5, including bilateral partial facetectomy, and bilateral lumbar decompression ?2. Left-sided L3-4, L4-5 transforaminal lumbar interbody fusion. ?3. Right-sided L3-4, L4-5 posterolateral fusion. ?4. Insertion of interbody device x 2 (Globus expandable intervertebral spacers). ?5. Placement of segmental posterior instrumentation L3, L4, L5, bilaterally. ?6. Use of local autograft. ?7. Use of morselized allograft - ViviGen. ?8. Intraoperative use of fluoroscopy. ?  ?SURGEON:  Phylliss Bob, MD. ?  ?ASSISTANT:  Pricilla Holm, PA-C. ?  ?ANESTHESIA:  General endotracheal anesthesia. ?  ?COMPLICATIONS:  None. ?  ?DISPOSITION:  Stable. ?  ?ESTIMATED BLOOD LOSS:  250cc ?  ?INDICATIONS FOR SURGERY:  Briefly, Ms. Heick is a pleasant 71 y.o. -year-old female, who did present to me with severe and ongoing pain in the bilateral legs. I did feel that the symptoms were secondary to the findings noted above.  The patient failed conservative care and did wish to proceed with the procedure  ?noted above.  ?  ?OPERATIVE DETAILS:  On 01/24/2022, the patient was brought to ?surgery and general endotracheal anesthesia was administered.  The ?patient was placed prone on a well-padded flat Jackson bed with a spinal ?frame.  Antibiotics were given and a time-out procedure was performed. ?The back was prepped and draped in the usual fashion.  A midline ?incision was made  overlying the L3-4 and L4-5 intervertebral spaces.  The fascia was incised at the midline.  The paraspinal musculature was bluntly ?swept laterally.  Anatomic landmarks for the pedicles were exposed. ?Using fluoroscopy, I did cannulate the L3, L4, and L5 pedicles bilaterally, ?using a medial to lateral cortical trajectory technique.  On the right ?side, the posterolateral gutter and facet joints at L3-4 and L4-5 were ?decorticated and 6 mm screws of the appropriate length were placed at L3, L4, and L5 pedicles and a 65-mm rod was placed ?and distraction was applied across the rod at each intervertebral level.  On the ?left side, the cannulated pedicle holes were filled with bone wax.  I ?then proceeded with the decompressive aspect of the procedure.     ?Starting at L4-5, I did perform a laminotomy and a full facetectomy on the left.  I was able to thoroughly and entirely decompress the L4-5 intervertebral space bilaterally, removing facet hypertrophy and ligamentum flavum hypertrophy.  At this point, with an assistant holding medial retraction of the traversing left L5 nerve, I did perform a thorough and complete L4-5 intervertebral discectomy.  The intervertebral space was then liberally packed with autograft from the decompression, as well as allograft in the form of ViviGen, as was the appropriately sized intervertebral spacer, which was distracted to 74m.  Distraction was then released on the contralateral right side.  I then turned my attention to the L3-4 level.  Once again, it was clearly evident that there was stenosis at the L3-4 level.  The stenosis  was thoroughly and adequately decompressed by performing a bilateral partial facetectomy.  I was able to thoroughly decompress both the left and right lateral recess at L3/4. With an assistant holding medial retraction of the traversing left L4 nerve, I did perform an annulotomy at the ?posterolateral aspect of the L3-4 intervertebral space.  I then used  a ?series of curettes and pituitary rongeurs to perform a thorough and ?complete intervertebral diskectomy.  The intervertebral space was then ?liberally packed with autograft as well as allograft in the form of ?ViviGen, as was the appropriate-sized intervertebral spacer.  The spacer ?was then tamped into position in the usual fashion, which was distracted to 68m.  I was very pleased with the press-fit of the spacer.  I then placed 6 mm screws on the ?left at L3, L4, and L5.  A 65-mm rod was then placed and caps were placed. The distraction was then released on the contralateral right side.  All ?6 caps were then locked.  The wound was copiously irrigated with a total ?of approximately 3 L prior to placing the bone graft.  Additional ?autograft and allograft were then packed into the posterolateral gutter ?on the right side to help aid in the success of the fusion.  The wound was  ?explored for any undue bleeding and there was no substantial bleeding encountered.  Gel-Foam was placed over the laminectomy site.  The wound was then closed in layers using #1 Vicryl followed by 2-0 Vicryl, followed by 4-0 Monocryl.  Benzoin and Steri-Strips were applied followed by sterile dressing.   ?  ?  ?Of note, KPricilla Holmwas my assistant throughout surgery, and did aid ?in retraction, suctioning, placement of the hardware, and closure. ?  ?  ?MPhylliss Bob MD  ?

## 2022-01-24 NOTE — Anesthesia Postprocedure Evaluation (Signed)
Anesthesia Post Note ? ?Patient: Wendy Soto ? ?Procedure(s) Performed: LEFT-SIDED LUMBAR 3- LUMBAR 4, LUMBAR 4- LUMBAR 5 TRANSFORAMINAL LUMBAR INTERBODY FUSION WITH INSTRUMENTATION AND ALLOGRAFT (Left: Spine Lumbar) ? ?  ? ?Patient location during evaluation: PACU ?Anesthesia Type: General ?Level of consciousness: awake and alert, oriented and patient cooperative ?Pain management: pain level controlled ?Vital Signs Assessment: post-procedure vital signs reviewed and stable ?Respiratory status: spontaneous breathing, nonlabored ventilation and respiratory function stable ?Cardiovascular status: blood pressure returned to baseline and stable ?Postop Assessment: no apparent nausea or vomiting ?Anesthetic complications: no ? ? ?No notable events documented. ? ?Last Vitals:  ?Vitals:  ? 01/24/22 0610  ?BP: (!) 109/49  ?Pulse: 68  ?Resp: 17  ?Temp: 36.6 ?C  ?SpO2: 98%  ?  ?Last Pain:  ?Vitals:  ? 01/24/22 0625  ?TempSrc:   ?PainSc: 3   ? ? ?  ?  ?  ?  ?  ?  ? ?Jarome Matin Broxton Broady ? ? ? ? ?

## 2022-01-24 NOTE — Transfer of Care (Signed)
Immediate Anesthesia Transfer of Care Note ? ?Patient: Wendy Soto ? ?Procedure(s) Performed: LEFT-SIDED LUMBAR 3- LUMBAR 4, LUMBAR 4- LUMBAR 5 TRANSFORAMINAL LUMBAR INTERBODY FUSION WITH INSTRUMENTATION AND ALLOGRAFT (Left: Spine Lumbar) ? ?Patient Location: PACU ? ?Anesthesia Type:General ? ?Level of Consciousness: drowsy ? ?Airway & Oxygen Therapy: Patient Spontanous Breathing ? ?Post-op Assessment: Report given to RN and Post -op Vital signs reviewed and stable ? ?Post vital signs: Reviewed and stable ? ?Last Vitals:  ?Vitals Value Taken Time  ?BP    ?Temp    ?Pulse    ?Resp    ?SpO2    ? ? ?Last Pain:  ?Vitals:  ? 01/24/22 0625  ?TempSrc:   ?PainSc: 3   ?   ? ?Patients Stated Pain Goal: 0 (01/24/22 0981) ? ?Complications: No notable events documented. ?

## 2022-01-24 NOTE — Anesthesia Procedure Notes (Signed)
Procedure Name: Intubation ?Date/Time: 01/24/2022 8:04 AM ?Performed by: Vonna Drafts, CRNA ?Pre-anesthesia Checklist: Patient identified, Emergency Drugs available, Suction available and Patient being monitored ?Patient Re-evaluated:Patient Re-evaluated prior to induction ?Oxygen Delivery Method: Circle system utilized ?Preoxygenation: Pre-oxygenation with 100% oxygen ?Induction Type: IV induction ?Ventilation: Mask ventilation without difficulty ?Laryngoscope Size: Mac and 4 ?Grade View: Grade I ?Tube type: Oral ?Tube size: 7.0 mm ?Number of attempts: 1 ?Airway Equipment and Method: Stylet ?Placement Confirmation: ETT inserted through vocal cords under direct vision, positive ETCO2 and breath sounds checked- equal and bilateral ?Secured at: 23 cm ?Tube secured with: Tape ?Dental Injury: Teeth and Oropharynx as per pre-operative assessment  ? ? ? ? ?

## 2022-01-24 NOTE — TOC Progression Note (Signed)
Transition of Care (TOC) - Progression Note  ? ? ?Patient Details  ?Name: JASAMINE POTTINGER ?MRN: [id number] ?Date of Birth: Jun 28, 1951 ? ?Transition of Care (TOC) CM/SW Contact  ?Marilu Favre, RN ?Phone Number: ?01/24/2022, 1:46 PM ? ?Clinical Narrative:    ? ?MD office has arranged home health services with [facility]. Amy with [facility] requesting orders  ? ?3C staff will supply needed DME  ? ?Transition of Care (TOC) Screening Note ? ? ?Patient Details  ?Name: MALAYSHIA ALL ?Date of Birth: 19-Nov-1950 ? ? ? ?Transition of Care Department [facility]) has reviewed patient and no TOC needs have been identified at this time. We will continue to monitor patient advancement through interdisciplinary progression rounds. If new patient transition needs arise, please place a TOC consult. ?  ? ?  ?  ? ?Expected Discharge Plan and Services ?  ?  ?  ?  ?  ?                ?  ?  ?  ?  ?  ?  ?  ?  ?  ?  ? ? ?Social Determinants of Health (SDOH) Interventions ?  ? ?Readmission Risk Interventions ?   ? View : No data to display.  ?  ?  ?  ? ? ?

## 2022-01-24 NOTE — H&P (Signed)
? ? ? ?PREOPERATIVE H&P ? ?Chief Complaint: Left > Right leg pain ? ?HPI: ?Wendy Soto is a 71 y.o. female who presents with ongoing pain in the bilateral legs, left > right ? ?MRI reveals spinal stenosis and instability at L3/4 and L4/5 ? ?Patient has failed multiple forms of conservative care and continues to have pain (see office notes for additional details regarding the patient's full course of treatment) ? ?Past Medical History:  ?Diagnosis Date  ? Allergy   ? Anemia   ? Anxiety   ? Arthritis   ? Asthma   ? Cancer [facility])   ? Complication of anesthesia   ? Diabetes mellitus without complication ([place])   ? Hypertension   ? Neuropathy   ? PONV (postoperative nausea and vomiting)   ? ?Past Surgical History:  ?Procedure Laterality Date  ? ABDOMINAL HYSTERECTOMY    ? APPENDECTOMY    ? EYE SURGERY    ? LIPOMA EXCISION    ? MENISCUS REPAIR Left   ? ?Social History  ? ?Socioeconomic History  ? Marital status: Single  ?  Spouse name: Not on file  ? Number of children: Not on file  ? Years of education: Not on file  ? Highest education level: Not on file  ?Occupational History  ? Not on file  ?Tobacco Use  ? Smoking status: Never  ? Smokeless tobacco: Never  ?Vaping Use  ? Vaping Use: Never used  ?Substance and Sexual Activity  ? Alcohol use: No  ? Drug use: No  ? Sexual activity: Not on file  ?Other Topics Concern  ? Not on file  ?Social History Narrative  ? One story home  ? No children  ? Usually right handed  ? ?Social Determinants of Health  ? ?Financial Resource Strain: Not on file  ?Food Insecurity: Not on file  ?Transportation Needs: Not on file  ?Physical Activity: Not on file  ?Stress: Not on file  ?Social Connections: Not on file  ? ?History reviewed. No pertinent family history. ?No Known Allergies ?Prior to Admission medications   ?Medication Sig Start Date End Date Taking? Authorizing Provider  ?albuterol (VENTOLIN HFA) 108 (90 Base) MCG/ACT inhaler Inhale 2 puffs into the lungs every 6 (six) hours as  needed for wheezing or shortness of breath.   Yes [provider]  ?B Complex-C (SUPER B COMPLEX PO) Take 1 tablet by mouth daily.   Yes [provider]  ?budesonide-formoterol (SYMBICORT) 160-4.5 MCG/ACT inhaler Inhale 2 puffs into the lungs 2 (two) times daily as needed (asthma).   Yes [provider]  ?citalopram (CELEXA) 20 MG tablet Take 20 mg by mouth every other day.   Yes [provider]  ?fexofenadine (ALLEGRA) 180 MG tablet Take 180 mg by mouth daily.   Yes [provider]  ?furosemide (LASIX) 40 MG tablet Take 40 mg by mouth daily.   Yes [provider]  ?gabapentin (NEURONTIN) 600 MG tablet Take 600 mg by mouth 3 (three) times daily. 12/04/19  Yes [provider]  ?lisinopril (ZESTRIL) 20 MG tablet Take 20 mg by mouth daily. 02/03/20  Yes [provider]  ?metFORMIN (GLUCOPHAGE) 500 MG tablet Take 500 mg by mouth 2 (two) times daily with a meal. 04/21/21  Yes [provider]  ?methocarbamol (ROBAXIN) 500 MG tablet Take 500 mg by mouth in the morning and at bedtime.   Yes [provider]  ?Multiple Vitamins-Minerals (MULTIVITAMIN WITH MINERALS) tablet Take 1 tablet by mouth daily.  Yes [provider]  ?Omega-3 Fatty Acids (FISH OIL) 1000 MG CAPS Take 2,000 mg by mouth daily.   Yes [provider]  ?potassium chloride SA (KLOR-CON M) 20 MEQ tablet Take 20 mEq by mouth daily.   Yes [provider]  ?Probiotic Product (PROBIOTIC DAILY) CAPS Take 1 capsule by mouth daily.   Yes [provider]  ?rosuvastatin (CRESTOR) 5 MG tablet Take 5 mg by mouth daily.   Yes [provider]  ?Blood Glucose Monitoring Suppl (ONE TOUCH ULTRA 2) w/Device KIT AS DIRECTED USE TO TEST BLOOD SUGARS DAILY INVITRO/ DX E11.65 01/27/19   [provider]  ?Donald Siva test strip  05/04/21   [provider]  ? ? ? ?All other systems have been reviewed and were otherwise negative with the  exception of those mentioned in the HPI and as above. ? ?Physical Exam: ?There were no vitals filed for this visit. ? ?There is no height or weight on file to calculate BMI. ? ?General: Alert, no acute distress ?Cardiovascular: No pedal edema ?Respiratory: No cyanosis, no use of accessory musculature ?Skin: No lesions in the area of chief complaint ?Neurologic: Sensation intact distally ?Psychiatric: Patient is competent for consent with normal mood and affect ?Lymphatic: No axillary or cervical lymphadenopathy ? ? ? ?Assessment/Plan: ?L3-4 and L4-5 spinal stenosis, spondylolisthesis at L4-5, lateral listhesis at L3-4. ?Plan for Procedure(s): ?LEFT-SIDED LUMBAR 3- LUMBAR 4, LUMBAR 4- LUMBAR 5 TRANSFORAMINAL LUMBAR INTERBODY FUSION WITH INSTRUMENTATION AND ALLOGRAFT ? ? ?Norva Karvonen, MD ?01/24/2022 ?6:09 AM  ?

## 2022-01-25 ENCOUNTER — Encounter (HOSPITAL_COMMUNITY): Payer: Self-pay | Admitting: Orthopedic Surgery

## 2022-01-25 DIAGNOSIS — M2428 Disorder of ligament, vertebrae: Secondary | ICD-10-CM | POA: Diagnosis not present

## 2022-01-25 DIAGNOSIS — Z79899 Other long term (current) drug therapy: Secondary | ICD-10-CM | POA: Diagnosis not present

## 2022-01-25 DIAGNOSIS — E1140 Type 2 diabetes mellitus with diabetic neuropathy, unspecified: Secondary | ICD-10-CM | POA: Diagnosis not present

## 2022-01-25 DIAGNOSIS — M5416 Radiculopathy, lumbar region: Secondary | ICD-10-CM | POA: Diagnosis not present

## 2022-01-25 DIAGNOSIS — I10 Essential (primary) hypertension: Secondary | ICD-10-CM | POA: Diagnosis not present

## 2022-01-25 DIAGNOSIS — Z7951 Long term (current) use of inhaled steroids: Secondary | ICD-10-CM | POA: Diagnosis not present

## 2022-01-25 DIAGNOSIS — M48061 Spinal stenosis, lumbar region without neurogenic claudication: Secondary | ICD-10-CM | POA: Diagnosis not present

## 2022-01-25 DIAGNOSIS — Z90710 Acquired absence of both cervix and uterus: Secondary | ICD-10-CM | POA: Diagnosis not present

## 2022-01-25 DIAGNOSIS — M5410 Radiculopathy, site unspecified: Secondary | ICD-10-CM | POA: Diagnosis not present

## 2022-01-25 DIAGNOSIS — Z7984 Long term (current) use of oral hypoglycemic drugs: Secondary | ICD-10-CM | POA: Diagnosis not present

## 2022-01-25 DIAGNOSIS — M4316 Spondylolisthesis, lumbar region: Secondary | ICD-10-CM | POA: Diagnosis not present

## 2022-01-25 DIAGNOSIS — M1990 Unspecified osteoarthritis, unspecified site: Secondary | ICD-10-CM | POA: Diagnosis not present

## 2022-01-25 DIAGNOSIS — J45909 Unspecified asthma, uncomplicated: Secondary | ICD-10-CM | POA: Diagnosis not present

## 2022-01-25 LAB — GLUCOSE, CAPILLARY
Glucose-Capillary: 137 mg/dL — ABNORMAL HIGH (ref 70–99)
Glucose-Capillary: 171 mg/dL — ABNORMAL HIGH (ref 70–99)
Glucose-Capillary: 162 mg/dL — ABNORMAL HIGH (ref 70–99)
Glucose-Capillary: 149 mg/dL — ABNORMAL HIGH (ref 70–99)

## 2022-01-25 MED ORDER — OXYCODONE-ACETAMINOPHEN 5-325 MG PO TABS: 1.0000 | ORAL_TABLET | Freq: Four times a day (QID) | ORAL | 0 refills | Status: AC | PRN

## 2022-01-25 MED ORDER — METHOCARBAMOL 500 MG PO TABS: 500.0000 mg | ORAL_TABLET | Freq: Four times a day (QID) | ORAL | 0 refills | Status: DC

## 2022-01-25 MED FILL — Thrombin (Recombinant) For Soln 20000 Unit: CUTANEOUS | Qty: 1 | Status: AC

## 2022-01-25 NOTE — Progress Notes (Addendum)
Occupational Therapy Evaluation ? ?Pt admitted with the below listed diagnosis and deficits.  She currently requires min guard assist to mod A for ADLs, and progressed to min A for functional mobility as she fatigued.   As she fatigued, her safety awareness decreased placing her at increased risk for falls.   She lives with her brother and reports she was mod I with ADLs and IADLs PTA.   Recommend HHOT and recommend BSC for home use as she becomes unsafe to ambulate to and from BR when fatigued and is at risk for injury and falls without BSC.  Will follow while hospitalized.  ? ? ? 01/25/22 1000  ?OT Visit Information  ?Last OT Received On 01/25/22  ?Assistance Needed +1  ?History of Present Illness This 71 y.o. female admitted for L3-4, L4-5 PLIF due to severe spinal stenosis.  PMH includes: asthma, arthritis, CA, DM, neuropathy  ?Precautions  ?Precautions Back;Fall  ?Precaution Booklet Issued Yes (comment)  ?Precaution Comments Pt requires mod cues to adhere to back precautions.  Pt is at high risk for falls as she fatigues  ?Required Braces or Orthoses Spinal Brace  ?Spinal Brace TLSO  ?Home Living  ?Family/patient expects to be discharged to: Private residence  ?Living Arrangements Other relatives  ?Available Help at Discharge Family ?(brother)  ?Type of Home House  ?Home Access Stairs to enter  ?Entrance Stairs-Number of Steps 3  ?Entrance Stairs-Rails Right  ?Home Layout One level  ?Bathroom Shower/Tub Walk-in shower  ?Bathroom Toilet Standard  ?Bathroom Accessibility Yes  ?Home Equipment [employer] (2 wheels);Cane - quad;Shower seat  ?Prior Function  ?Prior Level of Function  Independent/Modified Independent  ?Mobility Comments uses quad cane for mobility  ?ADLs Comments Pt reports she drives and is mod I with IADLs  ?Communication  ?Communication No difficulties  ?Pain Assessment  ?Pain Assessment Faces  ?Faces Pain Scale 4  ?Pain Location back  ?Pain Descriptors / Indicators Operative site guarding   ?Pain Intervention(s) Monitored during session;Premedicated before session  ?Cognition  ?Arousal/Alertness Awake/alert  ?Behavior During Therapy [facility] for tasks assessed/performed  ?Overall Cognitive Status Within Functional Limits for tasks assessed  ?General Comments Pt is hyperverbose and frequently self distracts requiring cues to focus on activity.  Pt requires cues for safety, and problem solving.  Likely baseline  ?Upper Extremity Assessment  ?Upper Extremity Assessment Generalized weakness  ?Lower Extremity Assessment  ?Lower Extremity Assessment Defer to PT evaluation  ?Cervical / Trunk Assessment  ?Cervical / Trunk Assessment Back Surgery  ?ADL  ?Overall ADL's  Needs assistance/impaired  ?Grooming Wash/dry hands;Wash/dry face;Oral care;Minimal assistance;Standing  ?Grooming Details (indicate cue type and reason) Pt requires assist for standing as she fatigues.  ?Upper Body Bathing Set up;Supervision/ safety;Sitting  ?Lower Body Bathing Moderate assistance;Sit to/from stand  ?Upper Body Dressing  Supervision/safety;Set up;Sitting  ?Lower Body Dressing Minimal assistance;Sit to/from stand  ?Lower Body Dressing Details (indicate cue type and reason) Pt requires mod cues to adhere to precautions and mod cues to problem solve through how to perform ADLs safely.  She was instructed how to use AD and where to acquire AD  ?Toilet Transfer Minimal assistance;Ambulation;Comfort height toilet;Rolling walker (2 wheels);Grab bars  ?Toilet Transfer Details (indicate cue type and reason) Pt required mod verbal cues for safe technique  ?Toileting- Clothing Manipulation and Hygiene Sit to/from stand;Min guard  ?Functional mobility during ADLs Min guard;Minimal assistance;Rolling walker (2 wheels);Cane  ?General ADL Comments Pt initially utilized quad cane, but as she fatigued, she demonstrated LOB and required RW  and min A for safety  ?Bed Mobility  ?General bed mobility comments pt sitting EOB  ?Transfers  ?Overall  transfer level Needs assistance  ?Equipment used Rolling walker (2 wheels)  ?Transfers Sit to/from Stand;Bed to chair/wheelchair/BSC  ?Sit to Stand Min guard  ?Bed to/from chair/wheelchair/BSC transfer type: Step pivot  ?Step pivot transfers Min guard;Min assist  ?General transfer comment verbal cues for problem solving and safety.  Min A as she fatigued  ?Balance  ?Overall balance assessment Needs assistance  ?Sitting-balance support Feet supported  ?Sitting balance-Leahy Scale Good  ?Standing balance support During functional activity;Reliant on assistive device for balance;Single extremity supported  ?Standing balance-Leahy Scale Poor  ?Standing balance comment required UE support  ?General Comments  ?General comments (skin integrity, edema, etc.) long discussion with pt re: safety concerns and risk of falls  ?OT - End of Session  ?Equipment Utilized During [employer] walker (2 wheels);Other (comment) ?(quad cane)  ?Activity Tolerance Patient tolerated treatment well  ?Patient left Other (comment) ?(with PT - pt sitting EOB)  ?Nurse Communication Mobility status  ?OT Assessment  ?OT Recommendation/Assessment Patient needs continued OT Services  ?OT Visit Diagnosis Pain;Unsteadiness on feet (R26.81)  ?Pain - part of body  ?(back)  ?OT Problem List Decreased strength;Decreased activity tolerance;Impaired balance (sitting and/or standing);Decreased safety awareness;Decreased knowledge of use of DME or AE;Decreased knowledge of precautions;Obesity;Pain  ?OT Plan  ?OT Frequency (ACUTE ONLY) Min 2X/week  ?OT Treatment/Interventions (ACUTE ONLY) Self-care/ADL training;DME and/or AE instruction;Therapeutic activities;Patient/family education;Balance training  ?AM-PAC OT "6 Clicks" Daily Activity Outcome Measure (Version 2)  ?Help from another person eating meals? 4  ?Help from another person taking care of personal grooming? 3  ?Help from another person toileting, which includes using toliet, bedpan, or urinal? 3   ?Help from another person bathing (including washing, rinsing, drying)? 2  ?Help from another person to put on and taking off regular upper body clothing? 3  ?Help from another person to put on and taking off regular lower body clothing? 3  ?6 Click Score 18  ?Progressive Mobility  ?What is the highest level of mobility based on the progressive mobility assessment? Level 5 (Walks with assist in room/hall) - Balance while stepping forward/back and can walk in room with assist - Complete  ?Activity Ambulated with assistance in room;Ambulated with assistance to bathroom  ?OT Recommendation  ?Follow Up Recommendations Home health OT  ?Assistance recommended at discharge Intermittent Supervision/Assistance  ?Patient can return home with the following A little help with walking and/or transfers;Help with stairs or ramp for entrance;Assist for transportation;Assistance with cooking/housework;A little help with bathing/dressing/bathroom  ?Functional Status Assessent Patient has had a recent decline in their functional status and demonstrates the ability to make significant improvements in function in a reasonable and predictable amount of time.  ?OT Equipment BSC/3in1  ?Individuals Consulted  ?Consulted and Agree with Results and Recommendations Patient  ?Acute Rehab OT Goals  ?Patient Stated Goal to go home  ?OT Goal Formulation With patient  ?Time For Goal Achievement 06/30/22  ?Potential to Achieve Goals Good  ?OT Time Calculation  ?OT Start Time (ACUTE ONLY) 0902  ?OT Stop Time (ACUTE ONLY) 1010  ?OT Time Calculation (min) 68 min  ?OT General Charges  ?$OT Visit 1 Visit  ?OT Evaluation  ?$OT Eval Moderate Complexity 1 Mod  ?OT Treatments  ?$Self Care/Home Management  53-67 mins  ?Written Expression  ?Dominant Hand Right  ?Zenola Dezarn C., OTR/L ?Acute Rehabilitation Services ?Pager [phone] ?Office [phone] ? ?

## 2022-01-25 NOTE — Progress Notes (Signed)
? ? ?  Patient doing well, she has been up and walking and has resolved BIL leg pain. She has well controlled PO LBP and is very pleased with her progress. She was hoping to progres shome today but was not cleared by PT. She has stairs in the home and PT wants her to stay 1 more day to practice stairs again and work with her brother to show restrictions. She is pleased at her results thus far ? ? ?Physical Exam: ?Vitals:  ? 01/25/22 0733 01/25/22 1109  ?BP: (!) 96/33 (!) 117/59  ?Pulse: 90 95  ?Resp: 18 18  ?Temp: 98.4 ?F (36.9 ?C) 98.1 ?F (36.7 ?C)  ?SpO2: 95% 96%  ? ? ?Dressing in place, CDI, brace adjusted to fit appropriately around waist and under arms, sternal bar lowered to be more comfortable. Pt sitting and standing comfortably  ?NVI ? ?POD #1 s/p L3-5 decompression and fusion, resolved pre-op leg pain and minimal well controlled PO LBP  ? ?- up with PT/OT, encourage ambulation ? -PT wants to keep her for 1 more day for more PT and to work with family for safe transition home  ?- Percocet for pain, Robaxin for muscle spasms ?- likely d/c home tomorrow with f/u in 2 weeks  ?

## 2022-01-25 NOTE — Evaluation (Addendum)
Physical Therapy Evaluation ? ?Patient Details ?Name: Wendy Soto ?MRN: [id number] ?DOB: Mar 06, 1951 ?Today's Date: 01/25/2022 ? ?History of Present Illness ? This pt is a 71 y/o female who prsents s/p L3-5 PLIF due to severe spinal stenosis on 01/24/2022.  PMH includes: asthma, arthritis, CA, DM, neuropathy ?  ?Clinical Impression ? Pt admitted with above diagnosis. At the time of PT eval, pt was able to demonstrate transfers and ambulation with Up to min assist and RW for support. Stair training required up to +2 mod assist for balance support and safety with negotiating 3 steps. Recommend an additional day to work with pt to maximize functional independence, safety, and decrease risk for falls. Pt will benefit from [facility] as she will have difficulty ambulating to/from the bathroom and is at a high risk for falls at this time. Pt was educated on precautions, brace application/wearing schedule, appropriate activity progression, and car transfer. Pt currently with functional limitations due to the deficits listed below (see PT Problem List). Pt will benefit from skilled PT to increase their independence and safety with mobility to allow discharge to the venue listed below.     ?   ? ?Recommendations for follow up therapy are one component of a multi-disciplinary discharge planning process, led by the attending physician.  Recommendations may be updated based on patient status, additional functional criteria and insurance authorization. ? ?Follow Up Recommendations Home health PT ? ?  ?Assistance Recommended at Discharge Frequent or constant Supervision/Assistance  ?Patient can return home with the following ? A little help with walking and/or transfers;Assistance with cooking/housework;Assist for transportation;Help with stairs or ramp for entrance ? ?  ?Equipment Recommendations Rolling walker (2 wheels);BSC/3in1  ?Recommendations for Other Services ?    ?  ?Functional Status Assessment Patient has had a recent decline in  their functional status and demonstrates the ability to make significant improvements in function in a reasonable and predictable amount of time.  ? ?  ?Precautions / Restrictions Precautions ?Precautions: Back;Fall ?Precaution Booklet Issued: Yes (comment) ?Precaution Comments: Pt requires mod cues to adhere to back precautions.  Pt is at high risk for falls as she fatigues ?Required Braces or Orthoses: Spinal Brace ?Spinal Brace: Thoracolumbosacral orthotic;Applied in sitting position ?Restrictions ?Weight Bearing Restrictions: No  ? ?  ? ?Mobility ? Bed Mobility ?  ?  ?  ?  ?  ?  ?  ?General bed mobility comments: Pt was received sitting up EOB when PT arrived. ?  ? ?Transfers ?Overall transfer level: Needs assistance ?Equipment used: Rolling walker (2 wheels) ?Transfers: Sit to/from Stand, Bed to chair/wheelchair/BSC ?Sit to Stand: Min guard, Min assist ?  ?Step pivot transfers: Min guard ?  ?  ?  ?General transfer comment: VC's for improved posture, hand palcement on seated surface for safety. Increased time to initiate. Min guard assist initially (with increased time and effort) and min assist at end of session after pt fatigued. ?  ? ?Ambulation/Gait ?Ambulation/Gait assistance: Min guard; Min assist ?Gait Distance (Feet): 25 Feet ?Assistive device: Rolling walker (2 wheels) ?Gait Pattern/deviations: Step-through pattern, Knee flexed in stance - right, Knee flexed in stance - left, Decreased stride length ?Gait velocity: Decreased ?Gait velocity interpretation: <1.31 ft/sec, indicative of household ambulator ?  ?General Gait Details: Pt wtih grossly flexed knees, worsening as pt fatigued. Pt was able to ambulate short distances with therapist limiting distance to save energy for stair training. ? ?Stairs ?Stairs: Yes ?Stairs assistance: Min assist, Mod assist, +2 physical assistance, +2 safety/equipment ?  Stair Management: One rail Right, With cane, Step to pattern, Forwards ?Number of Stairs: 3 ?General stair  comments: +2 min assist with ascending stairs and SPC in the L hand, Railing on the R. Heavy +2 mod to control lower to the next step down initially with SPC in the R hand, and then attempting sideways negotiation for more control. ? ?Wheelchair Mobility ?  ? ?Modified Rankin (Stroke Patients Only) ?  ? ?  ? ?Balance Overall balance assessment: Needs assistance ?Sitting-balance support: Feet supported, No upper extremity supported ?Sitting balance-Leahy Scale: Fair ?  ?  ?Standing balance support: Single extremity supported, During functional activity ?Standing balance-Leahy Scale: Poor ?Standing balance comment: Unsteady, requiring BUE support for safe dynamic standing activity. ?  ?  ?  ?  ?  ?  ?  ?  ?  ?  ?  ?   ? ? ? ?Pertinent Vitals/Pain Pain Assessment ?Pain Assessment: Faces ?Faces Pain Scale: Hurts little more ?Pain Location: knees, back ?Pain Descriptors / Indicators: Operative site guarding, Aching ?Pain Intervention(s): Limited activity within patient's tolerance, Monitored during session, Repositioned  ? ? ?Home Living Family/patient expects to be discharged to:: Private residence ?Living Arrangements: Other relatives ?Available Help at Discharge: Family (brother) ?Type of Home: House ?Home Access: Stairs to enter ?Entrance Stairs-Rails: Right ?Entrance Stairs-Number of Steps: 3 ?  ?Home Layout: One level ?Home Equipment: Cane - quad;Shower seat;Rollator (4 wheels) ?   ?  ?Prior Function   ?  ?  ?  ?  ?  ?  ?Mobility Comments: QC use all the time ?  ?  ? ? ?Hand Dominance  ?   ? ?  ?Extremity/Trunk Assessment  ? Upper Extremity Assessment ?Upper Extremity Assessment: Defer to OT evaluation ?  ? ?Lower Extremity Assessment ?Lower Extremity Assessment: Generalized weakness;LLE deficits/detail ?LLE Deficits / Details: Pt reports her LLE was giving her the most trouble PTA and she favors this leg more. Bilaterally, knees were flexing as pt fatigued and she had difficulty powering back up to full stand as  session progressed. ?  ? ?Cervical / Trunk Assessment ?Cervical / Trunk Assessment: Back Surgery  ?Communication  ? Communication: No difficulties  ?Cognition Arousal/Alertness: Awake/alert ?Behavior During Therapy: [facility] for tasks assessed/performed ?Overall Cognitive Status: Impaired/Different from baseline ?Area of Impairment: Attention, Following commands, Safety/judgement, Problem solving ?  ?  ?  ?  ?  ?  ?  ?  ?  ?Current Attention Level: Selective ?  ?Following Commands: Follows multi-step commands with increased time ?Safety/Judgement: Decreased awareness of safety, Decreased awareness of deficits ?  ?Problem Solving: Requires verbal cues ?  ?  ?  ? ?  ?General Comments   ? ?  ?Exercises    ? ?Assessment/Plan  ?  ?PT Assessment Patient needs continued PT services  ?PT Problem List Decreased strength;Decreased activity tolerance;Decreased balance;Decreased mobility;Decreased knowledge of use of DME;Decreased knowledge of precautions;Decreased safety awareness;Pain ? ?   ?  ?PT Treatment Interventions DME instruction;Gait training;Stair training;Functional mobility training;Therapeutic activities;Therapeutic exercise;Balance training;Cognitive remediation;Patient/family education   ? ?PT Goals (Current goals can be found in the Care Plan section)  ?Acute Rehab PT Goals ?Patient Stated Goal: Return home today ?PT Goal Formulation: With patient ?Time For Goal Achievement: 02/01/22 ?Potential to Achieve Goals: Good ? ?  ?Frequency Min 5X/week ?  ? ? ?Co-evaluation   ?  ?  ?  ?  ? ? ?  ?AM-PAC PT "6 Clicks" Mobility  ?Outcome Measure Help needed turning from your back  to your side while in a flat bed without using bedrails?: A Little ?Help needed moving from lying on your back to sitting on the side of a flat bed without using bedrails?: A Little ?Help needed moving to and from a bed to a chair (including a wheelchair)?: A Little ?Help needed standing up from a chair using your arms (e.g., wheelchair or bedside  chair)?: A Little ?Help needed to walk in hospital room?: A Little ?Help needed climbing 3-5 steps with a railing? : Total ?6 Click Score: 16 ? ?  ?End of Session Equipment Utilized During Treatment: Ga

## 2022-01-26 LAB — GLUCOSE, CAPILLARY: Glucose-Capillary: 107 mg/dL — ABNORMAL HIGH (ref 70–99)

## 2022-01-26 NOTE — Progress Notes (Signed)
Physical Therapy Treatment ? ?Patient Details ?Name: Wendy Soto ?MRN: [id number] ?DOB: 09-02-1951 ?Today's Date: 01/26/2022 ? ? ?History of Present Illness This pt is a 71 y/o female who presents s/p L3-5 PLIF due to severe spinal stenosis on 01/24/2022.  PMH includes: asthma, arthritis, CA, DM, neuropathy ? ?  ?PT Comments  ? ? Pt progressing with post-op mobility. She was able to demonstrate transfers and ambulation with min guard assist to supervision for safety, and RW for support. On stairs, consistent min assist provided. Gait belt issued. Pt and brother were educated on precautions, brace application/wearing schedule, appropriate activity progression, and car transfer. She anticipates d/c home today. Will continue to follow.   ?   ?Recommendations for follow up therapy are one component of a multi-disciplinary discharge planning process, led by the attending physician.  Recommendations may be updated based on patient status, additional functional criteria and insurance authorization. ? ?Follow Up Recommendations ? Home health PT ?  ?  ?Assistance Recommended at Discharge Frequent or constant Supervision/Assistance  ?Patient can return home with the following A little help with walking and/or transfers;Assistance with cooking/housework;Assist for transportation;Help with stairs or ramp for entrance ?  ?Equipment Recommendations ? Rolling walker (2 wheels);BSC/3in1  ?  ?Recommendations for Other Services   ? ? ?  ?Precautions / Restrictions Precautions ?Precautions: Back;Fall ?Precaution Booklet Issued: Yes (comment) ?Precaution Comments: Pt requires mod cues to adhere to back precautions.  Pt is at high risk for falls as she fatigues ?Required Braces or Orthoses: Spinal Brace ?Spinal Brace: Thoracolumbosacral orthotic;Applied in sitting position ?Restrictions ?Weight Bearing Restrictions: No  ?  ? ?Mobility ? Bed Mobility ?Overal bed mobility: Needs Assistance ?Bed Mobility: Rolling, Sidelying to Sit, Sit to  Sidelying ?Rolling: Modified independent (Device/Increase time) ?Sidelying to sit: Supervision ?  ?  ?Sit to sidelying: Min assist ?General bed mobility comments: Assist to elevate LE's up into bed. Slow and effortful for all aspects however able to elevate trunk to full sitting without assist. ?  ? ?Transfers ?Overall transfer level: Needs assistance ?Equipment used: Rolling walker (2 wheels) ?Transfers: Sit to/from Stand ?Sit to Stand: Supervision ?  ?  ?  ?  ?  ?General transfer comment: VC's for improved posture, hand placement on seated surface for safety. Increased time to initiate. Close supervision for safety but no physical assist required to achieve full stand. Good controlled lower back to chair at end of session. ?  ? ?Ambulation/Gait ?Ambulation/Gait assistance: Min guard ?Gait Distance (Feet): 75 Feet ?Assistive device: Rolling walker (2 wheels) ?Gait Pattern/deviations: Step-through pattern, Knee flexed in stance - right, Knee flexed in stance - left, Decreased stride length ?Gait velocity: Decreased ?Gait velocity interpretation: <1.31 ft/sec, indicative of household ambulator ?  ?General Gait Details: Mildly flexed knees throughout gait cycle. Improved gait speed from yesterday. No overt LOB noted and pt reports only mild fatigue. ? ? ?Stairs ?Stairs: Yes ?Stairs assistance: Min assist ?Stair Management: One rail Right, With cane, Step to pattern, Sideways ?Number of Stairs: 3 ?General stair comments: Min assist to boost up with ascending stairs and SPC in the L hand, Railing on the R. Assist to control lower to the next step down with BUE's on railing sideways for more control. ? ? ?Wheelchair Mobility ?  ? ?Modified Rankin (Stroke Patients Only) ?  ? ? ?  ?Balance Overall balance assessment: Needs assistance ?Sitting-balance support: Feet supported, No upper extremity supported ?Sitting balance-Leahy Scale: Fair ?  ?  ?Standing balance support: Single extremity supported,  During functional  activity ?Standing balance-Leahy Scale: Poor ?Standing balance comment: Unsteady, requiring BUE support for safe dynamic standing activity. ?  ?  ?  ?  ?  ?  ?  ?  ?  ?  ?  ?  ? ?  ?Cognition Arousal/Alertness: Awake/alert ?Behavior During Therapy: [facility] for tasks assessed/performed ?Overall Cognitive Status: Impaired/Different from baseline ?Area of Impairment: Safety/judgement, Problem solving ?  ?  ?  ?  ?  ?  ?  ?  ?  ?  ?  ?  ?Safety/Judgement: Decreased awareness of safety, Decreased awareness of deficits ?  ?Problem Solving: Requires verbal cues ?  ?  ?  ? ?  ?Exercises   ? ?  ?General Comments   ?  ?  ? ?Pertinent Vitals/Pain Pain Assessment ?Pain Assessment: Faces ?Faces Pain Scale: Hurts little more ?Pain Location: knees, back ?Pain Descriptors / Indicators: Operative site guarding, Aching ?Pain Intervention(s): Limited activity within patient's tolerance, Monitored during session, Repositioned  ? ? ?Home Living   ?  ?  ?  ?  ?  ?  ?  ?  ?  ?   ?  ?Prior Function    ?  ?  ?   ? ?PT Goals (current goals can now be found in the care plan section) Acute Rehab PT Goals ?Patient Stated Goal: Return home today ?PT Goal Formulation: With patient ?Time For Goal Achievement: 02/01/22 ?Potential to Achieve Goals: Good ?Progress towards PT goals: Progressing toward goals ? ?  ?Frequency ? ? ? Min 5X/week ? ? ? ?  ?PT Plan Current plan remains appropriate  ? ? ?Co-evaluation   ?  ?  ?  ?  ? ?  ?AM-PAC PT "6 Clicks" Mobility   ?Outcome Measure ? Help needed turning from your back to your side while in a flat bed without using bedrails?: None ?Help needed moving from lying on your back to sitting on the side of a flat bed without using bedrails?: A Little ?Help needed moving to and from a bed to a chair (including a wheelchair)?: A Little ?Help needed standing up from a chair using your arms (e.g., wheelchair or bedside chair)?: A Little ?Help needed to walk in hospital room?: A Little ?Help needed climbing 3-5 steps with  a railing? : A Little ?6 Click Score: 19 ? ?  ?End of Session Equipment Utilized During Treatment: Gait belt;Back brace ?Activity Tolerance: Patient tolerated treatment well ?Patient left: in chair;with call bell/phone within reach;with family/visitor present ?Nurse Communication: Mobility status ?PT Visit Diagnosis: Unsteadiness on feet (R26.81);Pain ?Pain - part of body:  (back, knees) ?  ? ? ?Time: 9179-1505 ?PT Time Calculation (min) (ACUTE ONLY): 31 min ? ?Charges:  $Gait Training: 23-37 mins          ?          ? ?Rolinda Roan, PT, DPT ?Acute Rehabilitation Services ?Pager: [phone] ?Office: [phone]  ? ? ?Thelma Comp ?01/26/2022, 11:07 AM ? ?

## 2022-01-26 NOTE — Progress Notes (Signed)
Occupational Therapy Treatment ?Patient Details ?Name: Wendy Soto ?MRN: [id number] ?DOB: [birth year] ?Today's Date: 01/26/2022 ? ? ?History of present illness This pt is a 71 y/o female who presents s/p L3-5 PLIF due to severe spinal stenosis on 01/24/2022.  PMH includes: asthma, arthritis, CA, DM, neuropathy ?  ?OT comments ? Pt is significantly improved this date.  She is able to complete ADLs with set up - min guard assist and demonstrates good awareness of precautions and safety.    ? ?Recommendations for follow up therapy are one component of a multi-disciplinary discharge planning process, led by the attending physician.  Recommendations may be updated based on patient status, additional functional criteria and insurance authorization. ?   ?Follow Up Recommendations ? Home health OT  ?  ?Assistance Recommended at Discharge Intermittent Supervision/Assistance  ?Patient can return home with the following ? A little help with walking and/or transfers;Help with stairs or ramp for entrance;Assist for transportation;Assistance with cooking/housework;A little help with bathing/dressing/bathroom ?  ?Equipment Recommendations ? BSC/3in1  ?  ?Recommendations for Other Services   ? ?  ?Precautions / Restrictions Precautions ?Precautions: Back;Fall ?Precaution Booklet Issued: Yes (comment) ?Precaution Comments: Pt requires mod cues to adhere to back precautions.  Pt is at high risk for falls as she fatigues ?Required Braces or Orthoses: Spinal Brace ?Spinal Brace: Thoracolumbosacral orthotic;Applied in sitting position ?Restrictions ?Weight Bearing Restrictions: No  ? ? ?  ? ?Mobility Bed Mobility ?  ?  ?  ?  ?  ?  ?  ?  ?  ? ?Transfers ?  ?  ?  ?  ?  ?  ?  ?  ?  ?  ?  ?  ?Balance Overall balance assessment: Needs assistance ?Sitting-balance support: Feet supported, No upper extremity supported ?Sitting balance-Leahy Scale: Fair ?  ?  ?Standing balance support: Single extremity supported, During functional activity ?Standing  balance-Leahy Scale: Poor ?Standing balance comment: Unsteady, requiring BUE support for safe dynamic standing activity. ?  ?  ?  ?  ?  ?  ?  ?  ?  ?  ?  ?   ? ?ADL either performed or assessed with clinical judgement  ? ?ADL Overall ADL's : Needs assistance/impaired ?  ?  ?Grooming: Wash/dry hands;Wash/dry face;Oral care;Brushing hair;Supervision/safety;Standing ?  ?Upper Body Bathing: Set up;Supervision/ safety;Sitting ?  ?Lower Body Bathing: Min guard;Sit to/from stand;Adhering to back precautions ?  ?Upper Body Dressing : Set up;Supervision/safety;Sitting ?  ?Lower Body Dressing: Min guard;Sit to/from stand;Adhering to back precautions ?  ?Toilet Transfer: Min guard;Ambulation;Comfort height toilet;Rolling walker (2 wheels);Grab bars ?  ?Toileting- Water quality scientist and Hygiene: Min guard;Sit to/from stand ?  ?  ?  ?Functional mobility during ADLs: Min guard;Rolling walker (2 wheels) ?General ADL Comments: Pt demonstrates good awareness of back precautions.  She was able to complete ADLs using AE as needed.   She demonstrates much better safety awareness this date.  Demonstrates good carry over of information ?  ? ?Extremity/Trunk Assessment Upper Extremity Assessment ?Upper Extremity Assessment: Generalized weakness ?  ?Lower Extremity Assessment ?Lower Extremity Assessment: Generalized weakness ?  ?  ?  ? ?Vision   ?  ?  ?Perception   ?  ?Praxis   ?  ? ?Cognition Arousal/Alertness: Awake/alert ?Behavior During Therapy: [facility] for tasks assessed/performed ?Overall Cognitive Status: Within Functional Limits for tasks assessed ?  ?  ?  ?  ?  ?  ?  ?  ?  ?  ?  ?  ?  ?  ?  ?  ?  General Comments: Pt demonstrates igood carry over of info provided yesterday ?  ?  ?   ?Exercises   ? ?  ?Shoulder Instructions   ? ? ?  ?General Comments brother supportive and able to assist as needed  ? ? ?Pertinent Vitals/ Pain       Pain Assessment ?Pain Assessment: Faces ?Faces Pain Scale: Hurts little more ?Pain Location: knees,  back ?Pain Descriptors / Indicators: Operative site guarding, Aching ?Pain Intervention(s): Monitored during session ? ?Home Living   ?  ?  ?  ?  ?  ?  ?  ?  ?  ?  ?  ?  ?  ?  ?  ?  ?  ?  ? ?  ?Prior Functioning/Environment    ?  ?  ?  ?   ? ?Frequency ? Min 2X/week  ? ? ? ? ?  ?Progress Toward Goals ? ?OT Goals(current goals can now be found in the care plan section) ? Progress towards OT goals: Progressing toward goals ? ?   ?Plan Discharge plan remains appropriate   ? ?Co-evaluation ? ? ?   ?  ?  ?  ?  ? ?  ?AM-PAC OT "6 Clicks" Daily Activity     ?Outcome Measure ? ? Help from another person eating meals?: None ?Help from another person taking care of personal grooming?: A Little ?Help from another person toileting, which includes using toliet, bedpan, or urinal?: A Little ?Help from another person bathing (including washing, rinsing, drying)?: A Little ?Help from another person to put on and taking off regular upper body clothing?: A Little ?Help from another person to put on and taking off regular lower body clothing?: A Little ?6 Click Score: 19 ? ?  ?End of Session Equipment Utilized During Treatment: Rolling walker (2 wheels);Back brace ? ?OT Visit Diagnosis: Pain;Unsteadiness on feet (R26.81) ?Pain - part of body:  (back) ?  ?Activity Tolerance Patient tolerated treatment well ?  ?Patient Left in chair;with call bell/phone within reach;with family/visitor present ?  ?Nurse Communication Mobility status ?  ? ?   ? ?Time: 2694-8546 ?OT Time Calculation (min): 25 min ? ?Charges: OT General Charges ?$OT Visit: 1 Visit ?OT Treatments ?$Self Care/Home Management : 23-37 mins ? ?Vallory Oetken C., OTR/L ?Acute Rehabilitation Services ?Pager [phone] ?Office [phone] ? ? ?Cadell Gabrielson M ?01/26/2022, 1:21 PM ?

## 2022-01-31 DIAGNOSIS — Z48811 Encounter for surgical aftercare following surgery on the nervous system: Secondary | ICD-10-CM | POA: Diagnosis not present

## 2022-01-31 NOTE — Discharge Summary (Signed)
? ? ?Patient ID: ?Wendy Soto ?MRN: [id number] ?DOB/AGE: [birth year] 71 y.o. ? ?Admit date: 01/24/2022 ?Discharge date: 01/26/2022 ? ?Admission Diagnoses:  ?Principal Problem: ?  Radiculopathy ? ? ?Discharge Diagnoses:  ?Same ? ?Past Medical History:  ?Diagnosis Date  ? Allergy   ? Anemia   ? Anxiety   ? Arthritis   ? Asthma   ? Cancer [facility])   ? Complication of anesthesia   ? Diabetes mellitus without complication ([place])   ? Hypertension   ? Neuropathy   ? PONV (postoperative nausea and vomiting)   ? ? ?Surgeries: Procedure(s): ?LEFT-SIDED LUMBAR 3- LUMBAR 4, LUMBAR 4- LUMBAR 5 TRANSFORAMINAL LUMBAR INTERBODY FUSION WITH INSTRUMENTATION AND ALLOGRAFT on 01/24/2022 ?  ?Consultants: None ? ?Discharged Condition: Improved ? ?Hospital Course: Wendy Soto is an 71 y.o. female who was admitted 01/24/2022 for operative treatment of Radiculopathy. Patient has severe unremitting pain that affects sleep, daily activities, and work/hobbies. After pre-op clearance the patient was taken to the operating room on 01/24/2022 and underwent  Procedure(s): ?LEFT-SIDED LUMBAR 3- LUMBAR 4, LUMBAR 4- LUMBAR 5 TRANSFORAMINAL LUMBAR INTERBODY FUSION WITH INSTRUMENTATION AND ALLOGRAFT.   ? ?Patient was given perioperative antibiotics:  ?Anti-infectives (From admission, onward)  ? ? Start     Dose/Rate Route Frequency Ordered Stop  ? 01/24/22 1630  ceFAZolin (ANCEF) IVPB 2g/100 mL premix       ? 2 g ?200 mL/hr over 30 Minutes Intravenous Every 8 hours 01/24/22 1308 01/24/22 2350  ? 01/24/22 0600  ceFAZolin (ANCEF) IVPB 3g/100 mL premix       ? 3 g ?200 mL/hr over 30 Minutes Intravenous On call to O.R. 01/24/22 0556 01/24/22 0827  ? ?  ?  ? ?Patient was given sequential compression devices, early ambulation to prevent DVT. ? ?Patient benefited maximally from hospital stay and there were no complications.   ? ?Recent vital signs: BP 130/67 (BP Location: Left Arm)   Pulse 95   Temp 98.5 ?F (36.9 ?C) (Oral)   Resp 18   Ht 5' 8.5" (1.74 m)   Wt  120.7 kg   SpO2 94%   BMI 39.86 kg/m?   ? ?Discharge Medications:   ?Allergies as of 01/26/2022   ?No Known Allergies ?  ? ?  ?Medication List  ?  ? ?TAKE these medications   ? ?albuterol 108 (90 Base) MCG/ACT inhaler ?Commonly known as: VENTOLIN HFA ?Inhale 2 puffs into the lungs every 6 (six) hours as needed for wheezing or shortness of breath. ?  ?budesonide-formoterol 160-4.5 MCG/ACT inhaler ?Commonly known as: SYMBICORT ?Inhale 2 puffs into the lungs 2 (two) times daily as needed (asthma). ?  ?citalopram 20 MG tablet ?Commonly known as: CELEXA ?Take 20 mg by mouth every other day. ?  ?fexofenadine 180 MG tablet ?Commonly known as: ALLEGRA ?Take 180 mg by mouth daily. ?  ?Fish Oil 1000 MG Caps ?Take 2,000 mg by mouth daily. ?  ?furosemide 40 MG tablet ?Commonly known as: LASIX ?Take 40 mg by mouth daily. ?  ?gabapentin 600 MG tablet ?Commonly known as: NEURONTIN ?Take 600 mg by mouth 3 (three) times daily. ?  ?lisinopril 20 MG tablet ?Commonly known as: ZESTRIL ?Take 20 mg by mouth daily. ?  ?metFORMIN 500 MG tablet ?Commonly known as: GLUCOPHAGE ?Take 500 mg by mouth 2 (two) times daily with a meal. ?  ?methocarbamol 500 MG tablet ?Commonly known as: ROBAXIN ?Take 500 mg by mouth in the morning and at bedtime. ?What changed: Another medication with the  same name was added. Make sure you understand how and when to take each. ?  ?methocarbamol 500 MG tablet ?Commonly known as: ROBAXIN ?Take 1-2 tablets (500-1,000 mg total) by mouth every 6 (six) hours. ?What changed: You were already taking a medication with the same name, and this prescription was added. Make sure you understand how and when to take each. ?  ?multivitamin with minerals tablet ?Take 1 tablet by mouth daily. ?  ?ONE TOUCH ULTRA 2 w/Device Kit ?AS DIRECTED USE TO TEST BLOOD SUGARS DAILY INVITRO/ DX E11.65 ?  ?OneTouch Ultra test strip ?Generic drug: glucose blood ?  ?oxyCODONE-acetaminophen 5-325 MG tablet ?Commonly known as:  PERCOCET/ROXICET ?Take 1 tablet by mouth every 6 (six) hours as needed for up to 14 days for severe pain or moderate pain. ?  ?potassium chloride SA 20 MEQ tablet ?Commonly known as: KLOR-CON M ?Take 20 mEq by mouth daily. ?  ?Probiotic Daily Caps ?Take 1 capsule by mouth daily. ?  ?rosuvastatin 5 MG tablet ?Commonly known as: CRESTOR ?Take 5 mg by mouth daily. ?  ?SUPER B COMPLEX PO ?Take 1 tablet by mouth daily. ?  ? ?  ? ? ?Diagnostic Studies: DG Lumbar Spine 2-3 Views ? ?Result Date: 01/24/2022 ?CLINICAL DATA:  Status post lumbar fusion. EXAM: LUMBAR SPINE - 2-3 VIEW COMPARISON:  Earlier today FINDINGS: Two images obtained portably in the operating room show interval placement of bilateral pedicle screws and posterior rods at L3 through L5. Interbody spacers are noted at L3-4 and L4-5. IMPRESSION: Status post posterior fusion of L3 through L5. Electronically Signed   By: Kerby Moors M.D.   On: 01/24/2022 11:43  ? ?DG Lumbar Spine 1 View ? ?Result Date: 01/24/2022 ?CLINICAL DATA:  Intraoperative lumbar spine level localization EXAM: LUMBAR SPINE - 1 VIEW COMPARISON:  MR 10/19/21. FINDINGS: There is a single portable radiograph of the lumbar spine obtained portably in the operating room. There is a surgical probe with tip posterior to the L3 spinous process and a second probe posterior to the L5 spinous process. Again noted is a anterolisthesis of L4 on L5. Mild multilevel degenerative disc disease. IMPRESSION: Surgical instruments are posterior to the L3 and L5 spinous processes. Electronically Signed   By: Kerby Moors M.D.   On: 01/24/2022 10:00  ? ?DG C-Arm 1-60 Min-No Report ? ?Result Date: 01/24/2022 ?Fluoroscopy was utilized by the requesting physician.  No radiographic interpretation.  ? ?DG C-Arm 1-60 Min-No Report ? ?Result Date: 01/24/2022 ?Fluoroscopy was utilized by the requesting physician.  No radiographic interpretation.  ? ?DG C-Arm 1-60 Min-No Report ? ?Result Date: 01/24/2022 ?Fluoroscopy was  utilized by the requesting physician.  No radiographic interpretation.   ? ?Disposition: Discharge disposition: 01-Home or Self Care ? ? ? ? ? ? ?Discharge Instructions   ? ? Discharge patient   Complete by: As directed ?  ? Discharge disposition: 01-Home or Self Care  ? Discharge patient date: 01/26/2022  ? Face-to-face encounter (required for Medicare/Medicaid patients)   Complete by: As directed ?  ? I Lennie Muckle Quaneisha Hanisch certify that this patient is under my care and that I, or a nurse practitioner or physician's assistant working with me, had a face-to-face encounter that meets the physician face-to-face encounter requirements with this patient on 01/26/2022. The encounter with the patient was in whole, or in part for the following medical condition(s) which is the primary reason for home health care (List medical condition): Lumbar fusion  ? The encounter with the patient  was in whole, or in part, for the following medical condition, which is the primary reason for home health care: Lumbar fusion  ? I certify that, based on my findings, the following services are medically necessary home health services: Physical therapy  ? Reason for Medically Necessary Home Health Services: Therapy- Home Adaptation to Facilitate Safety  ? My clinical findings support the need for the above services: Unable to leave home safely without assistance and/or assistive device  ? Further, I certify that my clinical findings support that this patient is homebound due to: Unable to leave home safely without assistance  ? Home Health   Complete by: As directed ?  ? To provide the following care/treatments:  PT ?OT  ?  ? ?  ? ?POD #2 s/p L3-5 decompression and fusion, resolved pre-op leg pain and minimal well controlled PO LBP  ?  ?- up with PT/OT, encourage ambulation, final visit today with family ?- Percocet for pain, Robaxin for muscle spasms ?-Scripts for pain sent to pharmacy electronically  ?-D/C instructions sheet printed and in  chart ?-D/C today  ?-F/U in office 2 weeks  ? ?Signed: ?Lennie Muckle Soloman Mckeithan ?01/31/2022, 12:18 PM ? ? ? ? ? ?  ?

## 2022-02-07 DIAGNOSIS — E1169 Type 2 diabetes mellitus with other specified complication: Secondary | ICD-10-CM | POA: Diagnosis not present

## 2022-02-07 DIAGNOSIS — E782 Mixed hyperlipidemia: Secondary | ICD-10-CM | POA: Diagnosis not present

## 2022-02-07 DIAGNOSIS — N183 Chronic kidney disease, stage 3 unspecified: Secondary | ICD-10-CM | POA: Diagnosis not present

## 2022-02-07 DIAGNOSIS — I10 Essential (primary) hypertension: Secondary | ICD-10-CM | POA: Diagnosis not present

## 2022-02-07 DIAGNOSIS — G8929 Other chronic pain: Secondary | ICD-10-CM | POA: Diagnosis not present

## 2022-02-21 DIAGNOSIS — Z981 Arthrodesis status: Secondary | ICD-10-CM | POA: Diagnosis not present

## 2022-03-13 DIAGNOSIS — Z9889 Other specified postprocedural states: Secondary | ICD-10-CM | POA: Diagnosis not present

## 2022-03-19 DIAGNOSIS — E1165 Type 2 diabetes mellitus with hyperglycemia: Secondary | ICD-10-CM | POA: Diagnosis not present

## 2022-03-19 DIAGNOSIS — E782 Mixed hyperlipidemia: Secondary | ICD-10-CM | POA: Diagnosis not present

## 2022-03-19 DIAGNOSIS — I10 Essential (primary) hypertension: Secondary | ICD-10-CM | POA: Diagnosis not present

## 2022-04-05 DIAGNOSIS — I10 Essential (primary) hypertension: Secondary | ICD-10-CM | POA: Diagnosis not present

## 2022-04-05 DIAGNOSIS — G8929 Other chronic pain: Secondary | ICD-10-CM | POA: Diagnosis not present

## 2022-04-05 DIAGNOSIS — N183 Chronic kidney disease, stage 3 unspecified: Secondary | ICD-10-CM | POA: Diagnosis not present

## 2022-04-05 DIAGNOSIS — E782 Mixed hyperlipidemia: Secondary | ICD-10-CM | POA: Diagnosis not present

## 2022-04-05 DIAGNOSIS — E1165 Type 2 diabetes mellitus with hyperglycemia: Secondary | ICD-10-CM | POA: Diagnosis not present

## 2022-04-12 DIAGNOSIS — R399 Unspecified symptoms and signs involving the genitourinary system: Secondary | ICD-10-CM | POA: Diagnosis not present

## 2022-04-12 DIAGNOSIS — N3000 Acute cystitis without hematuria: Secondary | ICD-10-CM | POA: Diagnosis not present

## 2022-04-23 DIAGNOSIS — Z9889 Other specified postprocedural states: Secondary | ICD-10-CM | POA: Diagnosis not present

## 2022-04-23 DIAGNOSIS — M545 Low back pain, unspecified: Secondary | ICD-10-CM | POA: Diagnosis not present

## 2022-04-30 DIAGNOSIS — F418 Other specified anxiety disorders: Secondary | ICD-10-CM | POA: Diagnosis not present

## 2022-04-30 DIAGNOSIS — J45909 Unspecified asthma, uncomplicated: Secondary | ICD-10-CM | POA: Diagnosis not present

## 2022-04-30 DIAGNOSIS — N390 Urinary tract infection, site not specified: Secondary | ICD-10-CM | POA: Diagnosis not present

## 2022-04-30 DIAGNOSIS — E782 Mixed hyperlipidemia: Secondary | ICD-10-CM | POA: Diagnosis not present

## 2022-04-30 DIAGNOSIS — J302 Other seasonal allergic rhinitis: Secondary | ICD-10-CM | POA: Diagnosis not present

## 2022-04-30 DIAGNOSIS — Z0000 Encounter for general adult medical examination without abnormal findings: Secondary | ICD-10-CM | POA: Diagnosis not present

## 2022-04-30 DIAGNOSIS — M1990 Unspecified osteoarthritis, unspecified site: Secondary | ICD-10-CM | POA: Diagnosis not present

## 2022-04-30 DIAGNOSIS — E1165 Type 2 diabetes mellitus with hyperglycemia: Secondary | ICD-10-CM | POA: Diagnosis not present

## 2022-04-30 DIAGNOSIS — I10 Essential (primary) hypertension: Secondary | ICD-10-CM | POA: Diagnosis not present

## 2022-04-30 DIAGNOSIS — Z8542 Personal history of malignant neoplasm of other parts of uterus: Secondary | ICD-10-CM | POA: Diagnosis not present

## 2022-05-02 DIAGNOSIS — R531 Weakness: Secondary | ICD-10-CM | POA: Diagnosis not present

## 2022-05-02 DIAGNOSIS — S39012D Strain of muscle, fascia and tendon of lower back, subsequent encounter: Secondary | ICD-10-CM | POA: Diagnosis not present

## 2022-05-07 DIAGNOSIS — S39012D Strain of muscle, fascia and tendon of lower back, subsequent encounter: Secondary | ICD-10-CM | POA: Diagnosis not present

## 2022-05-07 DIAGNOSIS — R531 Weakness: Secondary | ICD-10-CM | POA: Diagnosis not present

## 2022-05-08 DIAGNOSIS — N183 Chronic kidney disease, stage 3 unspecified: Secondary | ICD-10-CM | POA: Diagnosis not present

## 2022-05-08 DIAGNOSIS — E782 Mixed hyperlipidemia: Secondary | ICD-10-CM | POA: Diagnosis not present

## 2022-05-08 DIAGNOSIS — I10 Essential (primary) hypertension: Secondary | ICD-10-CM | POA: Diagnosis not present

## 2022-05-08 DIAGNOSIS — E1165 Type 2 diabetes mellitus with hyperglycemia: Secondary | ICD-10-CM | POA: Diagnosis not present

## 2022-05-10 DIAGNOSIS — R531 Weakness: Secondary | ICD-10-CM | POA: Diagnosis not present

## 2022-05-10 DIAGNOSIS — S39012D Strain of muscle, fascia and tendon of lower back, subsequent encounter: Secondary | ICD-10-CM | POA: Diagnosis not present

## 2022-05-11 DIAGNOSIS — Z1151 Encounter for screening for human papillomavirus (HPV): Secondary | ICD-10-CM | POA: Diagnosis not present

## 2022-05-11 DIAGNOSIS — Z01419 Encounter for gynecological examination (general) (routine) without abnormal findings: Secondary | ICD-10-CM | POA: Diagnosis not present

## 2022-05-11 DIAGNOSIS — Z124 Encounter for screening for malignant neoplasm of cervix: Secondary | ICD-10-CM | POA: Diagnosis not present

## 2022-05-11 DIAGNOSIS — Z0000 Encounter for general adult medical examination without abnormal findings: Secondary | ICD-10-CM | POA: Diagnosis not present

## 2022-05-11 DIAGNOSIS — Z8541 Personal history of malignant neoplasm of cervix uteri: Secondary | ICD-10-CM | POA: Diagnosis not present

## 2022-05-14 DIAGNOSIS — R531 Weakness: Secondary | ICD-10-CM | POA: Diagnosis not present

## 2022-05-14 DIAGNOSIS — S39012D Strain of muscle, fascia and tendon of lower back, subsequent encounter: Secondary | ICD-10-CM | POA: Diagnosis not present

## 2022-05-17 DIAGNOSIS — R531 Weakness: Secondary | ICD-10-CM | POA: Diagnosis not present

## 2022-05-17 DIAGNOSIS — S39012D Strain of muscle, fascia and tendon of lower back, subsequent encounter: Secondary | ICD-10-CM | POA: Diagnosis not present

## 2022-05-21 DIAGNOSIS — S39012D Strain of muscle, fascia and tendon of lower back, subsequent encounter: Secondary | ICD-10-CM | POA: Diagnosis not present

## 2022-05-21 DIAGNOSIS — R531 Weakness: Secondary | ICD-10-CM | POA: Diagnosis not present

## 2022-05-22 DIAGNOSIS — M1711 Unilateral primary osteoarthritis, right knee: Secondary | ICD-10-CM | POA: Diagnosis not present

## 2022-05-22 DIAGNOSIS — M25561 Pain in right knee: Secondary | ICD-10-CM | POA: Diagnosis not present

## 2022-05-24 DIAGNOSIS — S39012D Strain of muscle, fascia and tendon of lower back, subsequent encounter: Secondary | ICD-10-CM | POA: Diagnosis not present

## 2022-05-24 DIAGNOSIS — R531 Weakness: Secondary | ICD-10-CM | POA: Diagnosis not present

## 2022-05-29 DIAGNOSIS — R531 Weakness: Secondary | ICD-10-CM | POA: Diagnosis not present

## 2022-05-29 DIAGNOSIS — S39012D Strain of muscle, fascia and tendon of lower back, subsequent encounter: Secondary | ICD-10-CM | POA: Diagnosis not present

## 2022-05-31 DIAGNOSIS — S39012D Strain of muscle, fascia and tendon of lower back, subsequent encounter: Secondary | ICD-10-CM | POA: Diagnosis not present

## 2022-05-31 DIAGNOSIS — R531 Weakness: Secondary | ICD-10-CM | POA: Diagnosis not present

## 2022-06-05 DIAGNOSIS — S39012D Strain of muscle, fascia and tendon of lower back, subsequent encounter: Secondary | ICD-10-CM | POA: Diagnosis not present

## 2022-06-05 DIAGNOSIS — R531 Weakness: Secondary | ICD-10-CM | POA: Diagnosis not present

## 2022-06-07 DIAGNOSIS — R531 Weakness: Secondary | ICD-10-CM | POA: Diagnosis not present

## 2022-06-07 DIAGNOSIS — S39012D Strain of muscle, fascia and tendon of lower back, subsequent encounter: Secondary | ICD-10-CM | POA: Diagnosis not present

## 2022-06-08 DIAGNOSIS — E1165 Type 2 diabetes mellitus with hyperglycemia: Secondary | ICD-10-CM | POA: Diagnosis not present

## 2022-06-08 DIAGNOSIS — N183 Chronic kidney disease, stage 3 unspecified: Secondary | ICD-10-CM | POA: Diagnosis not present

## 2022-06-08 DIAGNOSIS — E782 Mixed hyperlipidemia: Secondary | ICD-10-CM | POA: Diagnosis not present

## 2022-06-08 DIAGNOSIS — I10 Essential (primary) hypertension: Secondary | ICD-10-CM | POA: Diagnosis not present

## 2022-06-11 DIAGNOSIS — R531 Weakness: Secondary | ICD-10-CM | POA: Diagnosis not present

## 2022-06-11 DIAGNOSIS — S39012D Strain of muscle, fascia and tendon of lower back, subsequent encounter: Secondary | ICD-10-CM | POA: Diagnosis not present

## 2022-06-18 DIAGNOSIS — S39012D Strain of muscle, fascia and tendon of lower back, subsequent encounter: Secondary | ICD-10-CM | POA: Diagnosis not present

## 2022-06-18 DIAGNOSIS — R531 Weakness: Secondary | ICD-10-CM | POA: Diagnosis not present

## 2022-06-21 DIAGNOSIS — R531 Weakness: Secondary | ICD-10-CM | POA: Diagnosis not present

## 2022-06-21 DIAGNOSIS — S39012D Strain of muscle, fascia and tendon of lower back, subsequent encounter: Secondary | ICD-10-CM | POA: Diagnosis not present

## 2022-06-25 DIAGNOSIS — R531 Weakness: Secondary | ICD-10-CM | POA: Diagnosis not present

## 2022-06-25 DIAGNOSIS — S39012D Strain of muscle, fascia and tendon of lower back, subsequent encounter: Secondary | ICD-10-CM | POA: Diagnosis not present

## 2022-06-26 DIAGNOSIS — G8929 Other chronic pain: Secondary | ICD-10-CM | POA: Diagnosis not present

## 2022-06-26 DIAGNOSIS — M545 Low back pain, unspecified: Secondary | ICD-10-CM | POA: Diagnosis not present

## 2022-06-28 DIAGNOSIS — R531 Weakness: Secondary | ICD-10-CM | POA: Diagnosis not present

## 2022-06-28 DIAGNOSIS — S39012D Strain of muscle, fascia and tendon of lower back, subsequent encounter: Secondary | ICD-10-CM | POA: Diagnosis not present

## 2022-07-03 DIAGNOSIS — S39012D Strain of muscle, fascia and tendon of lower back, subsequent encounter: Secondary | ICD-10-CM | POA: Diagnosis not present

## 2022-07-03 DIAGNOSIS — R531 Weakness: Secondary | ICD-10-CM | POA: Diagnosis not present

## 2022-07-05 DIAGNOSIS — E1165 Type 2 diabetes mellitus with hyperglycemia: Secondary | ICD-10-CM | POA: Diagnosis not present

## 2022-07-05 DIAGNOSIS — E782 Mixed hyperlipidemia: Secondary | ICD-10-CM | POA: Diagnosis not present

## 2022-07-05 DIAGNOSIS — I10 Essential (primary) hypertension: Secondary | ICD-10-CM | POA: Diagnosis not present

## 2022-07-09 DIAGNOSIS — S39012D Strain of muscle, fascia and tendon of lower back, subsequent encounter: Secondary | ICD-10-CM | POA: Diagnosis not present

## 2022-07-09 DIAGNOSIS — R531 Weakness: Secondary | ICD-10-CM | POA: Diagnosis not present

## 2022-07-19 ENCOUNTER — Encounter: Payer: Self-pay | Admitting: Podiatry

## 2022-07-19 ENCOUNTER — Ambulatory Visit (INDEPENDENT_AMBULATORY_CARE_PROVIDER_SITE_OTHER): Payer: No Typology Code available for payment source | Admitting: Podiatry

## 2022-07-19 ENCOUNTER — Ambulatory Visit (INDEPENDENT_AMBULATORY_CARE_PROVIDER_SITE_OTHER): Payer: No Typology Code available for payment source

## 2022-07-19 DIAGNOSIS — L84 Corns and callosities: Secondary | ICD-10-CM | POA: Diagnosis not present

## 2022-07-19 DIAGNOSIS — E1149 Type 2 diabetes mellitus with other diabetic neurological complication: Secondary | ICD-10-CM | POA: Diagnosis not present

## 2022-07-19 DIAGNOSIS — M779 Enthesopathy, unspecified: Secondary | ICD-10-CM | POA: Diagnosis not present

## 2022-07-19 MED ORDER — DOXYCYCLINE MONOHYDRATE 100 MG PO CAPS: 100.0000 mg | ORAL_CAPSULE | Freq: Two times a day (BID) | ORAL | 0 refills | Status: DC

## 2022-07-20 ENCOUNTER — Ambulatory Visit: Payer: No Typology Code available for payment source | Admitting: Podiatry

## 2022-07-20 NOTE — Progress Notes (Signed)
Subjective:   Patient ID: Wendy Soto, female   DOB: 71 y.o.   MRN: [id number]   HPI Patient presents with a lot of pain underneath the left foot stating that she is also concerned because she has diabetes and does not want to get any kind of breakdown of skin    ROS      Objective:  Physical Exam  Neurovascular status found to be unchanged from previous visit with inflammation fluid around the forefoot left and also lesion formation neck to be moderately tender.  Patient is found to have good digital perfusion currently she is well-oriented F2 appears to be a forefoot inflammatory condition with the possibility that lesions are contributory to pain     Assessment:  Reviewed this above     Plan:  Explained condition to patient and reviewed at great length daily inspections of her feet and the possibilities that eventually this could become an ulcer if were not careful.  Good shoe gear recommended offloading and Tylenol as needed.  If symptoms continue we will have to consider something more aggressive

## 2022-07-23 ENCOUNTER — Ambulatory Visit: Payer: No Typology Code available for payment source | Admitting: Podiatry

## 2022-07-25 DIAGNOSIS — S39012D Strain of muscle, fascia and tendon of lower back, subsequent encounter: Secondary | ICD-10-CM | POA: Diagnosis not present

## 2022-07-25 DIAGNOSIS — R531 Weakness: Secondary | ICD-10-CM | POA: Diagnosis not present

## 2022-08-09 DIAGNOSIS — E782 Mixed hyperlipidemia: Secondary | ICD-10-CM | POA: Diagnosis not present

## 2022-08-09 DIAGNOSIS — I10 Essential (primary) hypertension: Secondary | ICD-10-CM | POA: Diagnosis not present

## 2022-08-09 DIAGNOSIS — N183 Chronic kidney disease, stage 3 unspecified: Secondary | ICD-10-CM | POA: Diagnosis not present

## 2022-08-09 DIAGNOSIS — E1165 Type 2 diabetes mellitus with hyperglycemia: Secondary | ICD-10-CM | POA: Diagnosis not present

## 2022-08-15 DIAGNOSIS — H04123 Dry eye syndrome of bilateral lacrimal glands: Secondary | ICD-10-CM | POA: Diagnosis not present

## 2022-08-15 DIAGNOSIS — H40013 Open angle with borderline findings, low risk, bilateral: Secondary | ICD-10-CM | POA: Diagnosis not present

## 2022-08-15 DIAGNOSIS — E119 Type 2 diabetes mellitus without complications: Secondary | ICD-10-CM | POA: Diagnosis not present

## 2022-08-15 DIAGNOSIS — H25813 Combined forms of age-related cataract, bilateral: Secondary | ICD-10-CM | POA: Diagnosis not present

## 2022-08-15 DIAGNOSIS — H1045 Other chronic allergic conjunctivitis: Secondary | ICD-10-CM | POA: Diagnosis not present

## 2022-08-17 DIAGNOSIS — Z0100 Encounter for examination of eyes and vision without abnormal findings: Secondary | ICD-10-CM | POA: Diagnosis not present

## 2022-08-30 DIAGNOSIS — M1711 Unilateral primary osteoarthritis, right knee: Secondary | ICD-10-CM | POA: Diagnosis not present

## 2022-08-30 DIAGNOSIS — M25561 Pain in right knee: Secondary | ICD-10-CM | POA: Diagnosis not present

## 2022-08-31 DIAGNOSIS — F419 Anxiety disorder, unspecified: Secondary | ICD-10-CM | POA: Diagnosis not present

## 2022-08-31 DIAGNOSIS — E785 Hyperlipidemia, unspecified: Secondary | ICD-10-CM | POA: Diagnosis not present

## 2022-08-31 DIAGNOSIS — E1169 Type 2 diabetes mellitus with other specified complication: Secondary | ICD-10-CM | POA: Diagnosis not present

## 2022-08-31 DIAGNOSIS — N1831 Chronic kidney disease, stage 3a: Secondary | ICD-10-CM | POA: Diagnosis not present

## 2022-08-31 DIAGNOSIS — I129 Hypertensive chronic kidney disease with stage 1 through stage 4 chronic kidney disease, or unspecified chronic kidney disease: Secondary | ICD-10-CM | POA: Diagnosis not present

## 2022-08-31 DIAGNOSIS — M545 Low back pain, unspecified: Secondary | ICD-10-CM | POA: Diagnosis not present

## 2022-08-31 DIAGNOSIS — Z008 Encounter for other general examination: Secondary | ICD-10-CM | POA: Diagnosis not present

## 2022-08-31 DIAGNOSIS — Z79891 Long term (current) use of opiate analgesic: Secondary | ICD-10-CM | POA: Diagnosis not present

## 2022-08-31 DIAGNOSIS — Z6839 Body mass index (BMI) 39.0-39.9, adult: Secondary | ICD-10-CM | POA: Diagnosis not present

## 2022-08-31 DIAGNOSIS — E1142 Type 2 diabetes mellitus with diabetic polyneuropathy: Secondary | ICD-10-CM | POA: Diagnosis not present

## 2022-08-31 DIAGNOSIS — M1990 Unspecified osteoarthritis, unspecified site: Secondary | ICD-10-CM | POA: Diagnosis not present

## 2022-08-31 DIAGNOSIS — R2681 Unsteadiness on feet: Secondary | ICD-10-CM | POA: Diagnosis not present

## 2022-09-03 DIAGNOSIS — N183 Chronic kidney disease, stage 3 unspecified: Secondary | ICD-10-CM | POA: Diagnosis not present

## 2022-09-03 DIAGNOSIS — E1165 Type 2 diabetes mellitus with hyperglycemia: Secondary | ICD-10-CM | POA: Diagnosis not present

## 2022-09-03 DIAGNOSIS — I10 Essential (primary) hypertension: Secondary | ICD-10-CM | POA: Diagnosis not present

## 2022-09-03 DIAGNOSIS — E782 Mixed hyperlipidemia: Secondary | ICD-10-CM | POA: Diagnosis not present

## 2022-10-05 DIAGNOSIS — N183 Chronic kidney disease, stage 3 unspecified: Secondary | ICD-10-CM | POA: Diagnosis not present

## 2022-10-05 DIAGNOSIS — E782 Mixed hyperlipidemia: Secondary | ICD-10-CM | POA: Diagnosis not present

## 2022-10-05 DIAGNOSIS — I10 Essential (primary) hypertension: Secondary | ICD-10-CM | POA: Diagnosis not present

## 2022-10-05 DIAGNOSIS — E1165 Type 2 diabetes mellitus with hyperglycemia: Secondary | ICD-10-CM | POA: Diagnosis not present

## 2022-10-09 DIAGNOSIS — M170 Bilateral primary osteoarthritis of knee: Secondary | ICD-10-CM | POA: Diagnosis not present

## 2022-10-09 DIAGNOSIS — M1712 Unilateral primary osteoarthritis, left knee: Secondary | ICD-10-CM | POA: Diagnosis not present

## 2022-10-10 DIAGNOSIS — K5730 Diverticulosis of large intestine without perforation or abscess without bleeding: Secondary | ICD-10-CM | POA: Diagnosis not present

## 2022-10-10 DIAGNOSIS — Z86010 Personal history of colonic polyps: Secondary | ICD-10-CM | POA: Diagnosis not present

## 2022-10-10 DIAGNOSIS — K648 Other hemorrhoids: Secondary | ICD-10-CM | POA: Diagnosis not present

## 2022-10-10 DIAGNOSIS — Z800 Family history of malignant neoplasm of digestive organs: Secondary | ICD-10-CM | POA: Diagnosis not present

## 2022-10-10 DIAGNOSIS — Z09 Encounter for follow-up examination after completed treatment for conditions other than malignant neoplasm: Secondary | ICD-10-CM | POA: Diagnosis not present

## 2022-10-10 DIAGNOSIS — D120 Benign neoplasm of cecum: Secondary | ICD-10-CM | POA: Diagnosis not present

## 2022-10-12 DIAGNOSIS — D120 Benign neoplasm of cecum: Secondary | ICD-10-CM | POA: Diagnosis not present

## 2022-11-07 DIAGNOSIS — I10 Essential (primary) hypertension: Secondary | ICD-10-CM | POA: Diagnosis not present

## 2022-11-07 DIAGNOSIS — E782 Mixed hyperlipidemia: Secondary | ICD-10-CM | POA: Diagnosis not present

## 2022-11-07 DIAGNOSIS — N183 Chronic kidney disease, stage 3 unspecified: Secondary | ICD-10-CM | POA: Diagnosis not present

## 2022-11-07 DIAGNOSIS — J302 Other seasonal allergic rhinitis: Secondary | ICD-10-CM | POA: Diagnosis not present

## 2022-11-07 DIAGNOSIS — F418 Other specified anxiety disorders: Secondary | ICD-10-CM | POA: Diagnosis not present

## 2022-11-07 DIAGNOSIS — E1165 Type 2 diabetes mellitus with hyperglycemia: Secondary | ICD-10-CM | POA: Diagnosis not present

## 2022-11-07 DIAGNOSIS — G8929 Other chronic pain: Secondary | ICD-10-CM | POA: Diagnosis not present

## 2022-11-07 DIAGNOSIS — J45909 Unspecified asthma, uncomplicated: Secondary | ICD-10-CM | POA: Diagnosis not present

## 2022-11-07 DIAGNOSIS — M1990 Unspecified osteoarthritis, unspecified site: Secondary | ICD-10-CM | POA: Diagnosis not present

## 2022-11-15 DIAGNOSIS — M1712 Unilateral primary osteoarthritis, left knee: Secondary | ICD-10-CM | POA: Diagnosis not present

## 2022-11-15 DIAGNOSIS — M170 Bilateral primary osteoarthritis of knee: Secondary | ICD-10-CM | POA: Diagnosis not present

## 2022-11-15 DIAGNOSIS — M1711 Unilateral primary osteoarthritis, right knee: Secondary | ICD-10-CM | POA: Diagnosis not present

## 2022-11-22 DIAGNOSIS — M1712 Unilateral primary osteoarthritis, left knee: Secondary | ICD-10-CM | POA: Diagnosis not present

## 2022-11-22 DIAGNOSIS — M1711 Unilateral primary osteoarthritis, right knee: Secondary | ICD-10-CM | POA: Diagnosis not present

## 2022-11-22 DIAGNOSIS — M170 Bilateral primary osteoarthritis of knee: Secondary | ICD-10-CM | POA: Diagnosis not present

## 2022-12-03 DIAGNOSIS — I10 Essential (primary) hypertension: Secondary | ICD-10-CM | POA: Diagnosis not present

## 2022-12-03 DIAGNOSIS — E1165 Type 2 diabetes mellitus with hyperglycemia: Secondary | ICD-10-CM | POA: Diagnosis not present

## 2022-12-03 DIAGNOSIS — N183 Chronic kidney disease, stage 3 unspecified: Secondary | ICD-10-CM | POA: Diagnosis not present

## 2022-12-03 DIAGNOSIS — E782 Mixed hyperlipidemia: Secondary | ICD-10-CM | POA: Diagnosis not present

## 2022-12-27 DIAGNOSIS — M1712 Unilateral primary osteoarthritis, left knee: Secondary | ICD-10-CM | POA: Diagnosis not present

## 2022-12-27 DIAGNOSIS — M1711 Unilateral primary osteoarthritis, right knee: Secondary | ICD-10-CM | POA: Diagnosis not present

## 2022-12-27 DIAGNOSIS — M170 Bilateral primary osteoarthritis of knee: Secondary | ICD-10-CM | POA: Diagnosis not present

## 2023-02-11 DIAGNOSIS — H40013 Open angle with borderline findings, low risk, bilateral: Secondary | ICD-10-CM | POA: Diagnosis not present

## 2023-03-08 DIAGNOSIS — Z6841 Body Mass Index (BMI) 40.0 and over, adult: Secondary | ICD-10-CM | POA: Diagnosis not present

## 2023-03-08 DIAGNOSIS — E119 Type 2 diabetes mellitus without complications: Secondary | ICD-10-CM | POA: Diagnosis not present

## 2023-03-28 DIAGNOSIS — M170 Bilateral primary osteoarthritis of knee: Secondary | ICD-10-CM | POA: Diagnosis not present

## 2023-03-28 DIAGNOSIS — M1712 Unilateral primary osteoarthritis, left knee: Secondary | ICD-10-CM | POA: Diagnosis not present

## 2023-03-28 DIAGNOSIS — M1711 Unilateral primary osteoarthritis, right knee: Secondary | ICD-10-CM | POA: Diagnosis not present

## 2023-05-07 DIAGNOSIS — M170 Bilateral primary osteoarthritis of knee: Secondary | ICD-10-CM | POA: Diagnosis not present

## 2023-05-31 DIAGNOSIS — Z6841 Body Mass Index (BMI) 40.0 and over, adult: Secondary | ICD-10-CM | POA: Diagnosis not present

## 2023-05-31 DIAGNOSIS — E782 Mixed hyperlipidemia: Secondary | ICD-10-CM | POA: Diagnosis not present

## 2023-05-31 DIAGNOSIS — I10 Essential (primary) hypertension: Secondary | ICD-10-CM | POA: Diagnosis not present

## 2023-05-31 DIAGNOSIS — E1122 Type 2 diabetes mellitus with diabetic chronic kidney disease: Secondary | ICD-10-CM | POA: Diagnosis not present

## 2023-05-31 DIAGNOSIS — N1831 Chronic kidney disease, stage 3a: Secondary | ICD-10-CM | POA: Diagnosis not present

## 2023-05-31 DIAGNOSIS — Z1331 Encounter for screening for depression: Secondary | ICD-10-CM | POA: Diagnosis not present

## 2023-05-31 DIAGNOSIS — F325 Major depressive disorder, single episode, in full remission: Secondary | ICD-10-CM | POA: Diagnosis not present

## 2023-05-31 DIAGNOSIS — Z0000 Encounter for general adult medical examination without abnormal findings: Secondary | ICD-10-CM | POA: Diagnosis not present

## 2023-05-31 DIAGNOSIS — R9431 Abnormal electrocardiogram [ECG] [EKG]: Secondary | ICD-10-CM | POA: Diagnosis not present

## 2023-05-31 DIAGNOSIS — E1140 Type 2 diabetes mellitus with diabetic neuropathy, unspecified: Secondary | ICD-10-CM | POA: Diagnosis not present

## 2023-05-31 DIAGNOSIS — Z9181 History of falling: Secondary | ICD-10-CM | POA: Diagnosis not present

## 2023-06-18 DIAGNOSIS — M170 Bilateral primary osteoarthritis of knee: Secondary | ICD-10-CM | POA: Diagnosis not present

## 2023-06-18 DIAGNOSIS — M25562 Pain in left knee: Secondary | ICD-10-CM | POA: Diagnosis not present

## 2023-06-18 DIAGNOSIS — M25561 Pain in right knee: Secondary | ICD-10-CM | POA: Diagnosis not present

## 2023-06-28 ENCOUNTER — Ambulatory Visit: Payer: No Typology Code available for payment source | Admitting: Cardiology

## 2023-07-22 DIAGNOSIS — M1990 Unspecified osteoarthritis, unspecified site: Secondary | ICD-10-CM | POA: Diagnosis not present

## 2023-07-22 DIAGNOSIS — F324 Major depressive disorder, single episode, in partial remission: Secondary | ICD-10-CM | POA: Diagnosis not present

## 2023-07-22 DIAGNOSIS — Z6839 Body mass index (BMI) 39.0-39.9, adult: Secondary | ICD-10-CM | POA: Diagnosis not present

## 2023-07-22 DIAGNOSIS — Z8541 Personal history of malignant neoplasm of cervix uteri: Secondary | ICD-10-CM | POA: Diagnosis not present

## 2023-07-22 DIAGNOSIS — Z008 Encounter for other general examination: Secondary | ICD-10-CM | POA: Diagnosis not present

## 2023-08-02 ENCOUNTER — Ambulatory Visit: Payer: No Typology Code available for payment source | Admitting: Cardiology

## 2023-08-06 ENCOUNTER — Encounter: Payer: Self-pay | Admitting: Cardiology

## 2023-08-06 ENCOUNTER — Ambulatory Visit: Payer: No Typology Code available for payment source | Attending: Cardiology | Admitting: Cardiology

## 2023-08-06 VITALS — BP 100/68 | HR 86 | Resp 16 | Ht 68.0 in | Wt 255.4 lb

## 2023-08-06 DIAGNOSIS — Z01812 Encounter for preprocedural laboratory examination: Secondary | ICD-10-CM

## 2023-08-06 DIAGNOSIS — E782 Mixed hyperlipidemia: Secondary | ICD-10-CM | POA: Diagnosis not present

## 2023-08-06 DIAGNOSIS — E66812 Obesity, class 2: Secondary | ICD-10-CM | POA: Diagnosis not present

## 2023-08-06 DIAGNOSIS — E119 Type 2 diabetes mellitus without complications: Secondary | ICD-10-CM | POA: Diagnosis not present

## 2023-08-06 DIAGNOSIS — Z6838 Body mass index (BMI) 38.0-38.9, adult: Secondary | ICD-10-CM | POA: Diagnosis not present

## 2023-08-06 DIAGNOSIS — Z01810 Encounter for preprocedural cardiovascular examination: Secondary | ICD-10-CM | POA: Diagnosis not present

## 2023-08-06 DIAGNOSIS — R9431 Abnormal electrocardiogram [ECG] [EKG]: Secondary | ICD-10-CM

## 2023-08-06 DIAGNOSIS — I10 Essential (primary) hypertension: Secondary | ICD-10-CM | POA: Diagnosis not present

## 2023-08-06 MED ORDER — METOPROLOL TARTRATE 25 MG PO TABS: 25.0000 mg | ORAL_TABLET | Freq: Two times a day (BID) | ORAL | 0 refills | Status: AC

## 2023-08-06 NOTE — Patient Instructions (Addendum)
Medication Instructions:  Your physician has recommended you make the following change in your medication:   1) START metoprolol tartrate (Lopressor) 25 mg twice daily (start 1 week prior to CT scan--on day of  your CT scan take first dose 2 hours before your appointment time)  *If you need a refill on your cardiac medications before your next appointment, please call your pharmacy*  Lab Work: TODAY: BMP If you have labs (blood work) drawn today and your tests are completely normal, you will receive your results only by: MyChart Message (if you have MyChart) OR A paper copy in the mail If you have any lab test that is abnormal or we need to change your treatment, we will call you to review the results.  Testing/Procedures: Your physician has requested that you have an echocardiogram. Echocardiography is a painless test that uses sound waves to create images of your heart. It provides your doctor with information about the size and shape of your heart and how well your heart's chambers and valves are working. This procedure takes approximately one hour. There are no restrictions for this procedure. Please do NOT wear cologne, perfume, aftershave, or lotions (deodorant is allowed). Please arrive 15 minutes prior to your appointment time.  Your physician has requested that you have cardiac CT. Cardiac computed tomography (CT) is a painless test that uses an x-ray machine to take clear, detailed pictures of your heart. For further information please visit [email or web address] Please follow instruction sheet as given.   Follow-Up: At [facility], you and your health needs are our priority.  As part of our continuing mission to provide you with exceptional heart care, we have created designated Provider Care Teams.  These Care Teams include your primary Cardiologist (physician) and Advanced Practice Providers (APPs -  Physician Assistants and Nurse Practitioners) who all work together to provide  you with the care you need, when you need it.  Your next appointment:   12 month(s) or as needed based on test results  The format for your next appointment:   In Person  Provider:   Tessa Lerner, DO {  Other Instructions   Your cardiac CT will be scheduled at:   [facility] [address] [place], [place] [address] [phone]  Please arrive at the [facility] and Children's Entrance (Entrance C2) of [facility] 30 minutes prior to test start time. You can use the FREE valet parking offered at entrance C (encouraged to control the heart rate for the test). Proceed to the [facility] Radiology Department (first floor) to check-in and test prep.  All radiology patients and guests should use entrance C2 at [facility], accessed from [facility], even though the hospital's physical address listed is [address]    Please follow these instructions carefully (unless otherwise directed):  An IV will be required for this test and Nitroglycerin will be given.   On the Night Before the Test: Be sure to Drink plenty of water. Do not consume any caffeinated/decaffeinated beverages or chocolate 12 hours prior to your test. Do not take any antihistamines 12 hours prior to your test.  On the Day of the Test: Drink plenty of water until 1 hour prior to the test. Do not eat any food 1 hour prior to test. You may take your regular medications prior to the test.  Take metoprolol (Lopressor) 25 mg two hours prior to test. FEMALES- please wear underwire-free bra if available, avoid dresses &  tight clothing      After the Test: Drink plenty of water. After receiving IV contrast, you may experience a mild flushed feeling. This is normal. On occasion, you may experience a mild rash up to 24 hours after the test. This is not dangerous. If this occurs, you can take Benadryl 25 mg and increase your fluid intake. If you experience trouble  breathing, this can be serious. If it is severe call 911 IMMEDIATELY. If it is mild, please call our office. If you take any of these medications: Glipizide/Metformin, Avandament, Glucavance, please do not take 48 hours after completing test unless otherwise instructed.  We will call to schedule your test 2-4 weeks out understanding that some insurance companies will need an authorization prior to the service being performed.   For more information and frequently asked questions, please visit our website : [email or web address]  For non-scheduling related questions, please contact the cardiac imaging nurse navigator should you have any questions/concerns: Cardiac Imaging Nurse Navigators Direct Office Dial: [phone]   For scheduling needs, including cancellations and rescheduling, please call [place], [phone].

## 2023-08-06 NOTE — Progress Notes (Signed)
Cardiology Office Note:    Date:  08/06/2023  NAME:  Wendy Soto    MRN: [id number] DOB:  07/25/1951   PCP:  Wendy Retort, MD  Former Cardiology Providers: None Primary Cardiologist:  Tessa Lerner, DO, [facility] (established care 08/06/23) Electrophysiologist:  None   Referring MD: Wendy Retort, MD  Reason of Consult: Pre-op knee replacement.   Chief Complaint  Patient presents with   New Patient (Initial Visit)    Pre-op    History of Present Illness:    Wendy Soto is a 72 y.o. Caucasian female whose past medical history and cardiovascular risk factors includes: Hypertension, hyperlipidemia, diabetes mellitus type 2, history of cervical cancer status post resection/chemo/radiation, obesity due to excess calories. She is being seen today for the evaluation of pre-op at the request of Wendy Retort, MD.  Patient was referred to the practice for preop evaluation and concern for abnormal EKG.  She denies any anginal chest pain or heart failure symptoms.  However she is also quite honest with regards to endorsing that she does not exercise much to comment on exertional chest pain or shortness of breath.  Her physical activity is limited to grocery shopping and the rate limiting factor for no significant exercises osteoarthritis of the knee/hip.  Patient is being considered for a right knee replacement w/ Dr. Luiz Blare at [facility] orthopedic and date to be determined (likely November 2024).  She had fusion surgery about approximately 2 years ago and did not have any postoperative complications.  Patient is a non-smoker. No prior history of coronary artery disease, ACS, PCI, PAD, CVA/TIA, PE/DVT.  Current Medications: Current Meds  Medication Sig   albuterol (VENTOLIN HFA) 108 (90 Base) MCG/ACT inhaler Inhale 2 puffs into the lungs every 6 (six) hours as needed for wheezing or shortness of breath.   B Complex-C (SUPER B COMPLEX PO) Take 1 tablet by mouth daily.   Blood  Glucose Monitoring Suppl (ONE TOUCH ULTRA 2) w/Device KIT AS DIRECTED USE TO TEST BLOOD SUGARS DAILY INVITRO/ DX E11.65   budesonide-formoterol (SYMBICORT) 160-4.5 MCG/ACT inhaler Inhale 2 puffs into the lungs 2 (two) times daily as needed (asthma).   citalopram (CELEXA) 20 MG tablet Take 20 mg by mouth every other day.   fexofenadine (ALLEGRA) 180 MG tablet Take 180 mg by mouth daily.   gabapentin (NEURONTIN) 600 MG tablet Take 600 mg by mouth 3 (three) times daily.   lisinopril (ZESTRIL) 5 MG tablet Take 20 mg by mouth daily.   methocarbamol (ROBAXIN) 500 MG tablet Take 500 mg by mouth 3 (three) times daily.   metoprolol tartrate (LOPRESSOR) 25 MG tablet Take 1 tablet (25 mg total) by mouth 2 (two) times daily. Start taking 1 week prior to CT scan--on day of your CT scan take first dose 2 hours prior to appointment time.   Multiple Vitamins-Minerals (MULTIVITAMIN WITH MINERALS) tablet Take 1 tablet by mouth daily.   Omega-3 Fatty Acids (FISH OIL) 1000 MG CAPS Take 2,000 mg by mouth daily.   ONETOUCH ULTRA test strip    potassium chloride SA (KLOR-CON M) 20 MEQ tablet Take 20 mEq by mouth daily.   Probiotic Product (PROBIOTIC DAILY) CAPS Take 1 capsule by mouth daily.   rosuvastatin (CRESTOR) 5 MG tablet Take 5 mg by mouth daily.     Allergies:    Patient has no known allergies.   Past Medical History: Past Medical History:  Diagnosis Date   Allergy    Anemia  Anxiety    Arthritis    Asthma    Cancer (HCC)    Chronic kidney disease    Complication of anesthesia    Decreased hearing    Depression    Diabetes mellitus without complication (HCC)    DM (diabetes mellitus) (HCC)    Estrogen deficiency    Glaucoma    History of cervical cancer    History of endometrial cancer    Hyperlipidemia    Hypertension    Lumbago with sciatica    Morbid obesity due to excess calories (HCC)    Neuropathy    Osteoarthritis    Osteoarthritis    Peripheral venous insufficiency     Personal history of colon polyps, unspecified    PONV (postoperative nausea and vomiting)     Past Surgical History: Past Surgical History:  Procedure Laterality Date   ABDOMINAL HYSTERECTOMY     APPENDECTOMY     EYE SURGERY     LIPOMA EXCISION     MENISCUS REPAIR Left    TRANSFORAMINAL LUMBAR INTERBODY FUSION (TLIF) WITH PEDICLE SCREW FIXATION 2 LEVEL Left 01/24/2022   Procedure: LEFT-SIDED LUMBAR 3- LUMBAR 4, LUMBAR 4- LUMBAR 5 TRANSFORAMINAL LUMBAR INTERBODY FUSION WITH INSTRUMENTATION AND ALLOGRAFT;  Surgeon: Estill Bamberg, MD;  Location: [facility] OR;  Service: Orthopedics;  Laterality: Left;    Social History: Social History   Tobacco Use   Smoking status: Never   Smokeless tobacco: Never  Vaping Use   Vaping status: Never Used  Substance Use Topics   Alcohol use: No   Drug use: No    Family History: History reviewed. No pertinent family history.  ROS:   Review of Systems  Cardiovascular:  Negative for chest pain, claudication, dyspnea on exertion, irregular heartbeat, leg swelling, near-syncope, orthopnea, palpitations, paroxysmal nocturnal dyspnea and syncope.  Respiratory:  Negative for shortness of breath.   Hematologic/Lymphatic: Negative for bleeding problem.  Musculoskeletal:  Positive for arthritis and joint pain. Negative for muscle cramps and myalgias.  Neurological:  Negative for dizziness and light-headedness.    EKGs/Labs/Other Studies Reviewed:   EKG Interpretation Date/Time:  Tuesday August 06 2023 14:42:08 EDT Ventricular Rate:  86 PR Interval:  178 QRS Duration:  134 QT Interval:  400 QTC Calculation: 478 R Axis:   -62  Text Interpretation: Normal sinus rhythm Left axis deviation Right bundle branch block Left anterior fasicular block Consider Lateral infarct , age undetermined When compared with ECG of 18-Jan-2022 14:19, No significant change since last tracing Confirmed by Tessa Lerner [phone]) on 08/06/2023 3:15:33 PM      Labs:    Latest Ref  Rng & Units 01/18/2022    2:30 PM  CBC  WBC 4.0 - 10.5 K/uL 7.1   Hemoglobin 12.0 - 15.0 g/dL 51.8   Hematocrit 84.1 - 46.0 % 36.6   Platelets 150 - 400 K/uL 206        Latest Ref Rng & Units 01/18/2022    2:30 PM  BMP  Glucose 70 - 99 mg/dL 99   BUN 8 - 23 mg/dL 15   Creatinine 6.60 - 1.00 mg/dL 6.30   Sodium 160 - 109 mmol/L 140   Potassium 3.5 - 5.1 mmol/L 4.4   Chloride 98 - 111 mmol/L 107   CO2 22 - 32 mmol/L 26   Calcium 8.9 - 10.3 mg/dL 9.8       Latest Ref Rng & Units 01/18/2022    2:30 PM 08/03/2019   12:06 PM  CMP  Glucose 70 -  99 mg/dL 99    BUN 8 - 23 mg/dL 15    Creatinine 1.61 - 1.00 mg/dL 0.96    Sodium 045 - 409 mmol/L 140    Potassium 3.5 - 5.1 mmol/L 4.4    Chloride 98 - 111 mmol/L 107    CO2 22 - 32 mmol/L 26    Calcium 8.9 - 10.3 mg/dL 9.8    Total Protein 6.1 - 8.1 g/dL  6.3     No results found for: "CHOL", "HDL", "LDLCALC", "LDLDIRECT", "TRIG", "CHOLHDL" No results for input(s): "LIPOA" in the last 8760 hours. No components found for: "NTPROBNP" No results for input(s): "PROBNP" in the last 8760 hours. No results for input(s): "TSH" in the last 8760 hours.  Labs August 2024 Total cholesterol 136, triglycerides 142, HDL 54, calculated LDL 57, non-HDL 82. A1c 5.2. BUN 19, creatinine 1.18. Sodium 140, potassium 4.9, chloride 106, bicarb 28. AST 19, ALT 17, alkaline phosphatase 44 Hemoglobin 12.4 g/dL  Physical Exam:    Today's Vitals   08/06/23 1447  BP: 100/68  Pulse: 86  Resp: 16  SpO2: 96%  Weight: 255 lb 6.4 oz (115.8 kg)  Height: 5\' 8"  (1.727 m)   Body mass index is 38.83 kg/m. Wt Readings from Last 3 Encounters:  08/06/23 255 lb 6.4 oz (115.8 kg)  01/24/22 266 lb (120.7 kg)  01/18/22 266 lb 12.8 oz (121 kg)    Physical Exam  Constitutional: No distress.  hemodynamically stable  Neck: No JVD present.  Cardiovascular: Normal rate, regular rhythm, S1 normal, S2 normal, intact distal pulses and normal pulses. Exam reveals no  gallop, no S3 and no S4.  No murmur heard. Pulses:      Dorsalis pedis pulses are 2+ on the right side and 2+ on the left side.       Posterior tibial pulses are 2+ on the right side and 2+ on the left side.  Pulmonary/Chest: Effort normal and breath sounds normal. No stridor. She has no wheezes. She has no rales.  Abdominal: Soft. Bowel sounds are normal. She exhibits no distension. There is no abdominal tenderness.  Musculoskeletal:        General: No edema.     Cervical back: Neck supple.  Neurological: She is alert and oriented to person, place, and time. She has intact cranial nerves (2-12).  Skin: Skin is warm and moist.  Skin discolored noted in bilateral lower extremities likely secondary to chronic venous insufficiency.     Impression & Recommendation(s):  Impression:   ICD-10-CM   1. Preop cardiovascular exam  Z01.810 CT CORONARY MORPH W/CTA COR W/SCORE W/CA W/CM &/OR WO/CM    ECHOCARDIOGRAM COMPLETE    2. Abnormal ECG  R94.31 EKG 12-Lead    3. Benign hypertension  I10     4. Mixed hyperlipidemia  E78.2     5. Diet-controlled diabetes mellitus (HCC)  E11.9     6. Class 2 severe obesity due to excess calories with serious comorbidity and body mass index (BMI) of 38.0 to 38.9 in adult (HCC)  W11.914    E66.01    Z68.38     7. Pre-procedure lab exam  [phone] Basic metabolic panel       Recommendation(s):  Preop cardiovascular exam Abnormal ECG Denies anginal chest pain or heart failure symptoms. Overall functional capacity is limited to going grocery shopping. History of diabetes, hyperlipidemia, hypertension. Denies history of ACS/severe valvular heart disease/TIA/CVA/DVT/PE. EKG today shows sinus rhythm with right bundle branch block and left  anterior fascicular block. Since her overall functional capacity is limited recommended additional workup prior to upcoming elective surgery.   Will order echocardiogram to evaluate for LVEF and regional wall motion  abnormalities.  As well as coronary CTA for further risk stratification.  Patient is agreeable with the plan of care. Will check a BMP prior to coronary CTA. Start Lopressor 25 mg p.o. twice daily a week prior to coronary CTA. Further recommendations to follow  Benign hypertension Office blood pressures are soft. Patient is no longer on Lasix and her dose of lisinopril has been reduced by PCP. At times she does endorse orthostasis. Currently being managed by PCP  Mixed hyperlipidemia Currently on rosuvastatin.   She denies myalgia or other side effects. Most recent lipids dated August 2024, independently reviewed as noted above.  LDL 57 mg/dL. Currently managed by primary care provider.  Class 2 severe obesity due to excess calories with serious comorbidity and body mass index (BMI) of 38.0 to 38.9 in adult [facility]) Body mass index is 38.83 kg/m. I reviewed with her importance of diet, regular physical activity/exercise, weight loss.   Patient is educated on the importance of increasing physical activity gradually as tolerated with a goal of moderate intensity exercise for 30 minutes a day 5 days a week.   Orders Placed:  Orders Placed This Encounter  Procedures   CT CORONARY MORPH W/CTA COR W/SCORE W/CA W/CM &/OR WO/CM    Standing Status:   Future    Standing Expiration Date:   08/05/2024    Order Specific Question:   If indicated for the ordered procedure, I authorize the administration of contrast media per Radiology protocol    Answer:   Yes    Order Specific Question:   Initiate Coronary CTA Adult Protocol    Answer:   Yes    Order Specific Question:   If indicated initiate Post Coronary CTA Hypotension Adult Protocol    Answer:   Yes    Order Specific Question:   Does the patient have a contrast media/X-ray dye allergy?    Answer:   No    Order Specific Question:   Authorization:    Answer:   FFR will be ordered if deemed medically necessary   Basic metabolic panel   EKG  12-Lead   ECHOCARDIOGRAM COMPLETE    Standing Status:   Future    Standing Expiration Date:   08/05/2024    Order Specific Question:   Where should this test be performed    Answer:   [facility])    Order Specific Question:   Does the patient weigh less than or greater than 250 lbs?    Answer:   Patient weighs greater than 250 lbs    Order Specific Question:   Perflutren DEFINITY (image enhancing agent) should be administered unless hypersensitivity or allergy exist    Answer:   Administer Perflutren    Order Specific Question:   Reason for exam-Echo    Answer:   Pre-operative cardiovascular examination Z01.810    As part of medical decision making results of the outside office records, labs, EKG were reviewed independently at today's visit.   Final Medication List:    Meds ordered this encounter  Medications   metoprolol tartrate (LOPRESSOR) 25 MG tablet    Sig: Take 1 tablet (25 mg total) by mouth 2 (two) times daily. Start taking 1 week prior to CT scan--on day of your CT scan take first dose 2 hours prior  to appointment time.    Dispense:  28 tablet    Refill:  0    Medications Discontinued During This Encounter  Medication Reason   methocarbamol (ROBAXIN) 500 MG tablet    furosemide (LASIX) 40 MG tablet Patient Preference   doxycycline (MONODOX) 100 MG capsule Patient Preference     Current Outpatient Medications:    albuterol (VENTOLIN HFA) 108 (90 Base) MCG/ACT inhaler, Inhale 2 puffs into the lungs every 6 (six) hours as needed for wheezing or shortness of breath., Disp: , Rfl:    B Complex-C (SUPER B COMPLEX PO), Take 1 tablet by mouth daily., Disp: , Rfl:    Blood Glucose Monitoring Suppl (ONE TOUCH ULTRA 2) w/Device KIT, AS DIRECTED USE TO TEST BLOOD SUGARS DAILY INVITRO/ DX E11.65, Disp: , Rfl:    budesonide-formoterol (SYMBICORT) 160-4.5 MCG/ACT inhaler, Inhale 2 puffs into the lungs 2 (two) times daily as needed (asthma)., Disp: , Rfl:     citalopram (CELEXA) 20 MG tablet, Take 20 mg by mouth every other day., Disp: , Rfl:    fexofenadine (ALLEGRA) 180 MG tablet, Take 180 mg by mouth daily., Disp: , Rfl:    gabapentin (NEURONTIN) 600 MG tablet, Take 600 mg by mouth 3 (three) times daily., Disp: , Rfl:    lisinopril (ZESTRIL) 5 MG tablet, Take 20 mg by mouth daily., Disp: , Rfl:    methocarbamol (ROBAXIN) 500 MG tablet, Take 500 mg by mouth 3 (three) times daily., Disp: , Rfl:    metoprolol tartrate (LOPRESSOR) 25 MG tablet, Take 1 tablet (25 mg total) by mouth 2 (two) times daily. Start taking 1 week prior to CT scan--on day of your CT scan take first dose 2 hours prior to appointment time., Disp: 28 tablet, Rfl: 0   Multiple Vitamins-Minerals (MULTIVITAMIN WITH MINERALS) tablet, Take 1 tablet by mouth daily., Disp: , Rfl:    Omega-3 Fatty Acids (FISH OIL) 1000 MG CAPS, Take 2,000 mg by mouth daily., Disp: , Rfl:    ONETOUCH ULTRA test strip, , Disp: , Rfl:    potassium chloride SA (KLOR-CON M) 20 MEQ tablet, Take 20 mEq by mouth daily., Disp: , Rfl:    Probiotic Product (PROBIOTIC DAILY) CAPS, Take 1 capsule by mouth daily., Disp: , Rfl:    rosuvastatin (CRESTOR) 5 MG tablet, Take 5 mg by mouth daily., Disp: , Rfl:    metFORMIN (GLUCOPHAGE) 500 MG tablet, Take 500 mg by mouth 2 (two) times daily with a meal., Disp: , Rfl:   Consent:   N/A  Disposition:   Return in about 1 year (around 08/05/2024), or if symptoms worsen or fail to improve. or sooner if needed.  Her questions and concerns were addressed to her satisfaction. She voices understanding of the recommendations provided during this encounter.    Signed, Tessa Lerner, DO, [facility] [place]  [facility]  [address] [place], [place] [address] [phone] 08/06/2023 5:13 PM

## 2023-08-07 LAB — BASIC METABOLIC PANEL
BUN/Creatinine Ratio: 16 (ref 12–28)
BUN: 18 mg/dL (ref 8–27)
CO2: 21 mmol/L (ref 20–29)
Calcium: 9.7 mg/dL (ref 8.7–10.3)
Chloride: 103 mmol/L (ref 96–106)
Creatinine, Ser: 1.13 mg/dL — ABNORMAL HIGH (ref 0.57–1.00)
Glucose: 88 mg/dL (ref 70–99)
Potassium: 4.4 mmol/L (ref 3.5–5.2)
Sodium: 140 mmol/L (ref 134–144)
eGFR: 52 mL/min/{1.73_m2} — ABNORMAL LOW (ref >59)

## 2023-08-21 ENCOUNTER — Telehealth (HOSPITAL_COMMUNITY): Payer: Self-pay | Admitting: *Deleted

## 2023-08-21 DIAGNOSIS — H04123 Dry eye syndrome of bilateral lacrimal glands: Secondary | ICD-10-CM | POA: Diagnosis not present

## 2023-08-21 DIAGNOSIS — H1045 Other chronic allergic conjunctivitis: Secondary | ICD-10-CM | POA: Diagnosis not present

## 2023-08-21 DIAGNOSIS — E119 Type 2 diabetes mellitus without complications: Secondary | ICD-10-CM | POA: Diagnosis not present

## 2023-08-21 DIAGNOSIS — H25812 Combined forms of age-related cataract, left eye: Secondary | ICD-10-CM | POA: Diagnosis not present

## 2023-08-21 DIAGNOSIS — H40013 Open angle with borderline findings, low risk, bilateral: Secondary | ICD-10-CM | POA: Diagnosis not present

## 2023-08-21 NOTE — Telephone Encounter (Signed)
Attempted to call patient regarding upcoming cardiac CT appointment. Left message on voicemail with name and callback number Hayley Sharpe RN Navigator Cardiac Imaging [facility] [phone] Office   

## 2023-08-22 ENCOUNTER — Ambulatory Visit (HOSPITAL_COMMUNITY)
Admission: RE | Admit: 2023-08-22 | Discharge: 2023-08-22 | Disposition: A | Discharge status: Home / Self Care | Payer: No Typology Code available for payment source | Source: Ambulatory Visit | Attending: Cardiology | Admitting: Cardiology

## 2023-08-22 DIAGNOSIS — Z01810 Encounter for preprocedural cardiovascular examination: Secondary | ICD-10-CM | POA: Diagnosis not present

## 2023-08-22 DIAGNOSIS — I517 Cardiomegaly: Secondary | ICD-10-CM | POA: Insufficient documentation

## 2023-08-22 DIAGNOSIS — I700 Atherosclerosis of aorta: Secondary | ICD-10-CM | POA: Insufficient documentation

## 2023-08-22 MED ORDER — IOHEXOL 350 MG/ML SOLN
95.0000 mL | Freq: Once | INTRAVENOUS | Status: AC | PRN
  Administered 2023-08-22: 95 mL via INTRAVENOUS

## 2023-08-22 MED ORDER — LACTATED RINGERS IV BOLUS
500.0000 mL | Freq: Once | INTRAVENOUS | Status: AC
  Administered 2023-08-22: 500 mL via INTRAVENOUS

## 2023-08-22 MED ORDER — NITROGLYCERIN 0.4 MG SL SUBL
SUBLINGUAL_TABLET | SUBLINGUAL | Status: AC
  Filled 2023-08-22: qty 2

## 2023-08-22 MED ORDER — NITROGLYCERIN 0.4 MG SL SUBL
0.8000 mg | SUBLINGUAL_TABLET | Freq: Once | SUBLINGUAL | Status: AC
  Administered 2023-08-22: 0.8 mg via SUBLINGUAL

## 2023-08-29 ENCOUNTER — Ambulatory Visit (HOSPITAL_COMMUNITY): Payer: No Typology Code available for payment source | Attending: Cardiology

## 2023-08-29 DIAGNOSIS — Z01810 Encounter for preprocedural cardiovascular examination: Secondary | ICD-10-CM | POA: Diagnosis not present

## 2023-08-29 LAB — ECHOCARDIOGRAM COMPLETE
AR max vel: 2.56 cm2
AV Area VTI: 2.49 cm2
AV Area mean vel: 2.54 cm2
AV Mean grad: 4 mm[Hg]
AV Peak grad: 8.1 mm[Hg]
Ao pk vel: 1.42 m/s
Area-P 1/2: 3.16 cm2
MV M vel: 5.37 m/s
MV Peak grad: 115.3 mm[Hg]
S' Lateral: 2.5 cm

## 2023-09-06 ENCOUNTER — Encounter: Payer: Self-pay | Admitting: Cardiology

## 2023-09-09 ENCOUNTER — Telehealth: Payer: Self-pay

## 2023-09-09 NOTE — Telephone Encounter (Signed)
Spoke with pt to go over pre-op letter that was sent to Dr. Luiz Blare office. Pt verbalized understanding and had no further questions at this time.

## 2023-09-17 DIAGNOSIS — H25812 Combined forms of age-related cataract, left eye: Secondary | ICD-10-CM | POA: Diagnosis not present

## 2023-09-19 DIAGNOSIS — M25561 Pain in right knee: Secondary | ICD-10-CM | POA: Diagnosis not present

## 2023-09-19 DIAGNOSIS — M25562 Pain in left knee: Secondary | ICD-10-CM | POA: Diagnosis not present

## 2023-09-24 DIAGNOSIS — H2511 Age-related nuclear cataract, right eye: Secondary | ICD-10-CM | POA: Diagnosis not present

## 2023-09-30 DIAGNOSIS — M1731 Unilateral post-traumatic osteoarthritis, right knee: Secondary | ICD-10-CM | POA: Diagnosis not present

## 2023-09-30 DIAGNOSIS — R262 Difficulty in walking, not elsewhere classified: Secondary | ICD-10-CM | POA: Diagnosis not present

## 2023-09-30 DIAGNOSIS — M25661 Stiffness of right knee, not elsewhere classified: Secondary | ICD-10-CM | POA: Diagnosis not present

## 2023-10-01 DIAGNOSIS — H25811 Combined forms of age-related cataract, right eye: Secondary | ICD-10-CM | POA: Diagnosis not present

## 2023-10-03 DIAGNOSIS — M25561 Pain in right knee: Secondary | ICD-10-CM | POA: Diagnosis not present

## 2023-10-03 DIAGNOSIS — M1711 Unilateral primary osteoarthritis, right knee: Secondary | ICD-10-CM | POA: Diagnosis not present

## 2023-10-16 DIAGNOSIS — M1711 Unilateral primary osteoarthritis, right knee: Secondary | ICD-10-CM | POA: Diagnosis not present

## 2023-10-16 DIAGNOSIS — Z96651 Presence of right artificial knee joint: Secondary | ICD-10-CM | POA: Diagnosis not present

## 2023-10-16 DIAGNOSIS — M25761 Osteophyte, right knee: Secondary | ICD-10-CM | POA: Diagnosis not present

## 2023-10-18 DIAGNOSIS — Z96651 Presence of right artificial knee joint: Secondary | ICD-10-CM | POA: Diagnosis not present

## 2023-10-18 DIAGNOSIS — M25661 Stiffness of right knee, not elsewhere classified: Secondary | ICD-10-CM | POA: Diagnosis not present

## 2023-10-21 DIAGNOSIS — Z96651 Presence of right artificial knee joint: Secondary | ICD-10-CM | POA: Diagnosis not present

## 2023-10-21 DIAGNOSIS — M25661 Stiffness of right knee, not elsewhere classified: Secondary | ICD-10-CM | POA: Diagnosis not present

## 2023-10-24 DIAGNOSIS — Z96651 Presence of right artificial knee joint: Secondary | ICD-10-CM | POA: Diagnosis not present

## 2023-10-24 DIAGNOSIS — M25661 Stiffness of right knee, not elsewhere classified: Secondary | ICD-10-CM | POA: Diagnosis not present

## 2023-10-25 DIAGNOSIS — Z96651 Presence of right artificial knee joint: Secondary | ICD-10-CM | POA: Diagnosis not present

## 2023-10-25 DIAGNOSIS — M25661 Stiffness of right knee, not elsewhere classified: Secondary | ICD-10-CM | POA: Diagnosis not present

## 2024-01-09 DIAGNOSIS — Z96651 Presence of right artificial knee joint: Secondary | ICD-10-CM | POA: Diagnosis not present

## 2024-01-09 DIAGNOSIS — M25661 Stiffness of right knee, not elsewhere classified: Secondary | ICD-10-CM | POA: Diagnosis not present

## 2024-01-13 DIAGNOSIS — M25661 Stiffness of right knee, not elsewhere classified: Secondary | ICD-10-CM | POA: Diagnosis not present

## 2024-01-13 DIAGNOSIS — Z96651 Presence of right artificial knee joint: Secondary | ICD-10-CM | POA: Diagnosis not present

## 2024-01-16 DIAGNOSIS — M25661 Stiffness of right knee, not elsewhere classified: Secondary | ICD-10-CM | POA: Diagnosis not present

## 2024-01-16 DIAGNOSIS — Z96651 Presence of right artificial knee joint: Secondary | ICD-10-CM | POA: Diagnosis not present

## 2024-01-20 DIAGNOSIS — M25661 Stiffness of right knee, not elsewhere classified: Secondary | ICD-10-CM | POA: Diagnosis not present

## 2024-01-20 DIAGNOSIS — Z96651 Presence of right artificial knee joint: Secondary | ICD-10-CM | POA: Diagnosis not present

## 2024-01-21 DIAGNOSIS — M25561 Pain in right knee: Secondary | ICD-10-CM | POA: Diagnosis not present

## 2024-01-21 DIAGNOSIS — M1712 Unilateral primary osteoarthritis, left knee: Secondary | ICD-10-CM | POA: Diagnosis not present

## 2024-01-23 DIAGNOSIS — Z96651 Presence of right artificial knee joint: Secondary | ICD-10-CM | POA: Diagnosis not present

## 2024-01-23 DIAGNOSIS — M25661 Stiffness of right knee, not elsewhere classified: Secondary | ICD-10-CM | POA: Diagnosis not present

## 2024-02-03 DIAGNOSIS — Z96651 Presence of right artificial knee joint: Secondary | ICD-10-CM | POA: Diagnosis not present

## 2024-02-03 DIAGNOSIS — M25661 Stiffness of right knee, not elsewhere classified: Secondary | ICD-10-CM | POA: Diagnosis not present

## 2024-02-05 DIAGNOSIS — Z96651 Presence of right artificial knee joint: Secondary | ICD-10-CM | POA: Diagnosis not present

## 2024-02-05 DIAGNOSIS — M25661 Stiffness of right knee, not elsewhere classified: Secondary | ICD-10-CM | POA: Diagnosis not present

## 2024-02-10 DIAGNOSIS — Z96651 Presence of right artificial knee joint: Secondary | ICD-10-CM | POA: Diagnosis not present

## 2024-02-10 DIAGNOSIS — M25661 Stiffness of right knee, not elsewhere classified: Secondary | ICD-10-CM | POA: Diagnosis not present

## 2024-02-13 DIAGNOSIS — M25661 Stiffness of right knee, not elsewhere classified: Secondary | ICD-10-CM | POA: Diagnosis not present

## 2024-02-13 DIAGNOSIS — Z96651 Presence of right artificial knee joint: Secondary | ICD-10-CM | POA: Diagnosis not present

## 2024-02-17 DIAGNOSIS — M25661 Stiffness of right knee, not elsewhere classified: Secondary | ICD-10-CM | POA: Diagnosis not present

## 2024-02-17 DIAGNOSIS — Z96651 Presence of right artificial knee joint: Secondary | ICD-10-CM | POA: Diagnosis not present

## 2024-02-20 DIAGNOSIS — Z96651 Presence of right artificial knee joint: Secondary | ICD-10-CM | POA: Diagnosis not present

## 2024-02-20 DIAGNOSIS — M25661 Stiffness of right knee, not elsewhere classified: Secondary | ICD-10-CM | POA: Diagnosis not present

## 2024-02-27 DIAGNOSIS — M25661 Stiffness of right knee, not elsewhere classified: Secondary | ICD-10-CM | POA: Diagnosis not present

## 2024-02-27 DIAGNOSIS — Z96651 Presence of right artificial knee joint: Secondary | ICD-10-CM | POA: Diagnosis not present

## 2024-03-05 DIAGNOSIS — M25661 Stiffness of right knee, not elsewhere classified: Secondary | ICD-10-CM | POA: Diagnosis not present

## 2024-03-05 DIAGNOSIS — Z96651 Presence of right artificial knee joint: Secondary | ICD-10-CM | POA: Diagnosis not present

## 2024-03-10 DIAGNOSIS — M25661 Stiffness of right knee, not elsewhere classified: Secondary | ICD-10-CM | POA: Diagnosis not present

## 2024-03-10 DIAGNOSIS — Z96651 Presence of right artificial knee joint: Secondary | ICD-10-CM | POA: Diagnosis not present

## 2024-03-12 DIAGNOSIS — M25661 Stiffness of right knee, not elsewhere classified: Secondary | ICD-10-CM | POA: Diagnosis not present

## 2024-03-12 DIAGNOSIS — Z96651 Presence of right artificial knee joint: Secondary | ICD-10-CM | POA: Diagnosis not present

## 2024-03-17 DIAGNOSIS — M25661 Stiffness of right knee, not elsewhere classified: Secondary | ICD-10-CM | POA: Diagnosis not present

## 2024-03-17 DIAGNOSIS — Z96651 Presence of right artificial knee joint: Secondary | ICD-10-CM | POA: Diagnosis not present

## 2024-03-20 DIAGNOSIS — M25661 Stiffness of right knee, not elsewhere classified: Secondary | ICD-10-CM | POA: Diagnosis not present

## 2024-03-20 DIAGNOSIS — Z96651 Presence of right artificial knee joint: Secondary | ICD-10-CM | POA: Diagnosis not present

## 2024-03-31 DIAGNOSIS — Z96651 Presence of right artificial knee joint: Secondary | ICD-10-CM | POA: Diagnosis not present

## 2024-03-31 DIAGNOSIS — M25661 Stiffness of right knee, not elsewhere classified: Secondary | ICD-10-CM | POA: Diagnosis not present

## 2024-04-23 DIAGNOSIS — M25561 Pain in right knee: Secondary | ICD-10-CM | POA: Diagnosis not present

## 2024-04-23 DIAGNOSIS — M25562 Pain in left knee: Secondary | ICD-10-CM | POA: Diagnosis not present

## 2024-04-27 DIAGNOSIS — F418 Other specified anxiety disorders: Secondary | ICD-10-CM | POA: Diagnosis not present

## 2024-04-27 DIAGNOSIS — E1169 Type 2 diabetes mellitus with other specified complication: Secondary | ICD-10-CM | POA: Diagnosis not present

## 2024-04-27 DIAGNOSIS — M1990 Unspecified osteoarthritis, unspecified site: Secondary | ICD-10-CM | POA: Diagnosis not present

## 2024-04-27 DIAGNOSIS — N1831 Chronic kidney disease, stage 3a: Secondary | ICD-10-CM | POA: Diagnosis not present

## 2024-05-28 DIAGNOSIS — F418 Other specified anxiety disorders: Secondary | ICD-10-CM | POA: Diagnosis not present

## 2024-05-28 DIAGNOSIS — M1990 Unspecified osteoarthritis, unspecified site: Secondary | ICD-10-CM | POA: Diagnosis not present

## 2024-05-28 DIAGNOSIS — E1169 Type 2 diabetes mellitus with other specified complication: Secondary | ICD-10-CM | POA: Diagnosis not present

## 2024-05-28 DIAGNOSIS — N1831 Chronic kidney disease, stage 3a: Secondary | ICD-10-CM | POA: Diagnosis not present

## 2024-06-28 DIAGNOSIS — M1990 Unspecified osteoarthritis, unspecified site: Secondary | ICD-10-CM | POA: Diagnosis not present

## 2024-06-28 DIAGNOSIS — E1169 Type 2 diabetes mellitus with other specified complication: Secondary | ICD-10-CM | POA: Diagnosis not present

## 2024-06-28 DIAGNOSIS — F418 Other specified anxiety disorders: Secondary | ICD-10-CM | POA: Diagnosis not present

## 2024-06-28 DIAGNOSIS — N1831 Chronic kidney disease, stage 3a: Secondary | ICD-10-CM | POA: Diagnosis not present

## 2024-07-02 DIAGNOSIS — H1045 Other chronic allergic conjunctivitis: Secondary | ICD-10-CM | POA: Diagnosis not present

## 2024-07-02 DIAGNOSIS — H5213 Myopia, bilateral: Secondary | ICD-10-CM | POA: Diagnosis not present

## 2024-07-02 DIAGNOSIS — H04123 Dry eye syndrome of bilateral lacrimal glands: Secondary | ICD-10-CM | POA: Diagnosis not present

## 2024-07-02 DIAGNOSIS — E119 Type 2 diabetes mellitus without complications: Secondary | ICD-10-CM | POA: Diagnosis not present

## 2024-07-02 DIAGNOSIS — Z961 Presence of intraocular lens: Secondary | ICD-10-CM | POA: Diagnosis not present

## 2024-07-02 DIAGNOSIS — H40013 Open angle with borderline findings, low risk, bilateral: Secondary | ICD-10-CM | POA: Diagnosis not present

## 2024-07-03 DIAGNOSIS — E782 Mixed hyperlipidemia: Secondary | ICD-10-CM | POA: Diagnosis not present

## 2024-07-03 DIAGNOSIS — N1831 Chronic kidney disease, stage 3a: Secondary | ICD-10-CM | POA: Diagnosis not present

## 2024-07-03 DIAGNOSIS — Z0000 Encounter for general adult medical examination without abnormal findings: Secondary | ICD-10-CM | POA: Diagnosis not present

## 2024-07-03 DIAGNOSIS — J45909 Unspecified asthma, uncomplicated: Secondary | ICD-10-CM | POA: Diagnosis not present

## 2024-07-03 DIAGNOSIS — Z23 Encounter for immunization: Secondary | ICD-10-CM | POA: Diagnosis not present

## 2024-07-03 DIAGNOSIS — E1122 Type 2 diabetes mellitus with diabetic chronic kidney disease: Secondary | ICD-10-CM | POA: Diagnosis not present

## 2024-07-03 DIAGNOSIS — Z1331 Encounter for screening for depression: Secondary | ICD-10-CM | POA: Diagnosis not present

## 2024-07-03 DIAGNOSIS — I10 Essential (primary) hypertension: Secondary | ICD-10-CM | POA: Diagnosis not present

## 2024-07-03 DIAGNOSIS — F325 Major depressive disorder, single episode, in full remission: Secondary | ICD-10-CM | POA: Diagnosis not present

## 2024-07-03 DIAGNOSIS — E1149 Type 2 diabetes mellitus with other diabetic neurological complication: Secondary | ICD-10-CM | POA: Diagnosis not present

## 2024-07-03 DIAGNOSIS — E2839 Other primary ovarian failure: Secondary | ICD-10-CM | POA: Diagnosis not present

## 2024-07-30 DIAGNOSIS — M1712 Unilateral primary osteoarthritis, left knee: Secondary | ICD-10-CM | POA: Diagnosis not present

## 2024-08-28 DIAGNOSIS — F418 Other specified anxiety disorders: Secondary | ICD-10-CM | POA: Diagnosis not present

## 2024-08-28 DIAGNOSIS — N1831 Chronic kidney disease, stage 3a: Secondary | ICD-10-CM | POA: Diagnosis not present

## 2024-08-28 DIAGNOSIS — M1990 Unspecified osteoarthritis, unspecified site: Secondary | ICD-10-CM | POA: Diagnosis not present

## 2024-08-28 DIAGNOSIS — E1169 Type 2 diabetes mellitus with other specified complication: Secondary | ICD-10-CM | POA: Diagnosis not present

## 2024-09-14 DIAGNOSIS — M1712 Unilateral primary osteoarthritis, left knee: Secondary | ICD-10-CM | POA: Diagnosis not present

## 2024-09-27 DIAGNOSIS — M1990 Unspecified osteoarthritis, unspecified site: Secondary | ICD-10-CM | POA: Diagnosis not present

## 2024-09-27 DIAGNOSIS — F418 Other specified anxiety disorders: Secondary | ICD-10-CM | POA: Diagnosis not present

## 2024-09-27 DIAGNOSIS — N1831 Chronic kidney disease, stage 3a: Secondary | ICD-10-CM | POA: Diagnosis not present

## 2024-09-27 DIAGNOSIS — E1169 Type 2 diabetes mellitus with other specified complication: Secondary | ICD-10-CM | POA: Diagnosis not present
